# Patient Record
Sex: Female | Born: 1950
Health system: Southern US, Community
[De-identification: ages and names within clinical notes are randomized; demographics above are authoritative.]

## PROBLEM LIST (undated history)

## (undated) DIAGNOSIS — F32A Depression, unspecified: Secondary | ICD-10-CM

## (undated) DIAGNOSIS — M542 Cervicalgia: Secondary | ICD-10-CM

## (undated) DIAGNOSIS — D649 Anemia, unspecified: Secondary | ICD-10-CM

## (undated) DIAGNOSIS — M2040 Other hammer toe(s) (acquired), unspecified foot: Secondary | ICD-10-CM

## (undated) DIAGNOSIS — M436 Torticollis: Secondary | ICD-10-CM

## (undated) DIAGNOSIS — F329 Major depressive disorder, single episode, unspecified: Secondary | ICD-10-CM

## (undated) DIAGNOSIS — R630 Anorexia: Secondary | ICD-10-CM

## (undated) DIAGNOSIS — F419 Anxiety disorder, unspecified: Secondary | ICD-10-CM

## (undated) HISTORY — DX: Anorexia: R63.0

## (undated) HISTORY — DX: Anxiety disorder, unspecified: F41.9

## (undated) HISTORY — DX: Depression, unspecified: F32.A

## (undated) HISTORY — PX: SHOULDER SURGERY: SHX246

## (undated) HISTORY — PX: TONSILECTOMY/ADENOIDECTOMY WITH MYRINGOTOMY: SHX6125

## (undated) HISTORY — DX: Major depressive disorder, single episode, unspecified: F32.9

## (undated) HISTORY — PX: INNER EAR SURGERY: SHX679

## (undated) HISTORY — PX: TUBAL LIGATION: SHX77

---

## 2005-05-28 ENCOUNTER — Ambulatory Visit: Payer: Self-pay | Admitting: Unknown Physician Specialty

## 2005-07-16 ENCOUNTER — Ambulatory Visit: Payer: Self-pay | Admitting: Unknown Physician Specialty

## 2005-10-15 ENCOUNTER — Ambulatory Visit: Payer: Self-pay | Admitting: Gastroenterology

## 2006-07-29 ENCOUNTER — Ambulatory Visit: Payer: Self-pay | Admitting: Unknown Physician Specialty

## 2007-01-06 ENCOUNTER — Ambulatory Visit: Payer: Self-pay | Admitting: Unknown Physician Specialty

## 2007-11-24 ENCOUNTER — Ambulatory Visit: Payer: Self-pay | Admitting: Unknown Physician Specialty

## 2008-06-13 LAB — HM COLONOSCOPY: HM Colonoscopy: NORMAL

## 2009-03-11 ENCOUNTER — Ambulatory Visit: Payer: Self-pay | Admitting: Family Medicine

## 2010-03-24 ENCOUNTER — Ambulatory Visit: Payer: Self-pay | Admitting: Family Medicine

## 2010-03-24 ENCOUNTER — Ambulatory Visit: Payer: Self-pay | Admitting: Cardiovascular Disease

## 2010-04-08 ENCOUNTER — Ambulatory Visit: Payer: Self-pay | Admitting: Family Medicine

## 2010-07-10 ENCOUNTER — Emergency Department: Payer: Self-pay | Admitting: Emergency Medicine

## 2011-05-11 ENCOUNTER — Ambulatory Visit: Payer: Self-pay | Admitting: Family Medicine

## 2012-03-23 LAB — HM PAP SMEAR: HM Pap smear: NORMAL

## 2012-05-16 ENCOUNTER — Ambulatory Visit: Payer: Self-pay | Admitting: Family Medicine

## 2012-05-16 LAB — HM MAMMOGRAPHY: HM Mammogram: NORMAL

## 2013-06-13 ENCOUNTER — Ambulatory Visit (INDEPENDENT_AMBULATORY_CARE_PROVIDER_SITE_OTHER): Payer: 59 | Admitting: Internal Medicine

## 2013-06-13 ENCOUNTER — Encounter: Payer: Self-pay | Admitting: Internal Medicine

## 2013-06-13 ENCOUNTER — Other Ambulatory Visit (HOSPITAL_COMMUNITY)
Admission: RE | Admit: 2013-06-13 | Discharge: 2013-06-13 | Disposition: A | Discharge status: Home / Self Care | Payer: 59 | Source: Ambulatory Visit | Attending: Internal Medicine | Admitting: Internal Medicine

## 2013-06-13 VITALS — BP 144/70 | HR 81 | Temp 98.4°F | Ht 63.0 in | Wt 110.0 lb

## 2013-06-13 DIAGNOSIS — Z1151 Encounter for screening for human papillomavirus (HPV): Secondary | ICD-10-CM | POA: Insufficient documentation

## 2013-06-13 DIAGNOSIS — Z01419 Encounter for gynecological examination (general) (routine) without abnormal findings: Secondary | ICD-10-CM | POA: Insufficient documentation

## 2013-06-13 DIAGNOSIS — Z1239 Encounter for other screening for malignant neoplasm of breast: Secondary | ICD-10-CM | POA: Insufficient documentation

## 2013-06-13 DIAGNOSIS — Z780 Asymptomatic menopausal state: Secondary | ICD-10-CM

## 2013-06-13 DIAGNOSIS — Z0000 Encounter for general adult medical examination without abnormal findings: Secondary | ICD-10-CM

## 2013-06-13 LAB — HM PAP SMEAR: HM Pap smear: NEGATIVE

## 2013-06-13 NOTE — Progress Notes (Signed)
Subjective:    Patient ID: Robyn House, female    DOB: 02/20/1951, 62 y.o.   MRN: [id number]  HPI 62YO female presents to establish care and for physical exam. Doing well. Follows healthy, vegetarian diet and exercises daily. No concerns today. Due for mammogram and bone density testing.  Outpatient Encounter Prescriptions as of 06/13/2013  Medication Sig Dispense Refill  . aspirin 81 MG tablet Take 81 mg by mouth daily.      . MULTIPLE VITAMINS PO Take by mouth.       No facility-administered encounter medications on file as of 06/13/2013.   BP 144/70  Pulse 81  Temp(Src) 98.4 F (36.9 C) (Oral)  Ht 5\' 3"  (1.6 m)  Wt 110 lb (49.896 kg)  BMI 19.49 kg/m2  SpO2 97%  Review of Systems  Constitutional: Negative for fever, chills, appetite change, fatigue and unexpected weight change.  HENT: Negative for ear pain, congestion, sore throat, trouble swallowing, neck pain, voice change and sinus pressure.   Eyes: Negative for visual disturbance.  Respiratory: Negative for cough, shortness of breath, wheezing and stridor.   Cardiovascular: Negative for chest pain, palpitations and leg swelling.  Gastrointestinal: Negative for nausea, vomiting, abdominal pain, diarrhea, constipation, blood in stool, abdominal distention and anal bleeding.  Genitourinary: Negative for dysuria and flank pain.  Musculoskeletal: Negative for myalgias, arthralgias and gait problem.  Skin: Negative for color change and rash.  Neurological: Negative for dizziness and headaches.  Hematological: Negative for adenopathy. Does not bruise/bleed easily.  Psychiatric/Behavioral: Negative for suicidal ideas, sleep disturbance and dysphoric mood. The patient is not nervous/anxious.        Objective:   Physical Exam  Constitutional: She is oriented to person, place, and time. She appears well-developed and well-nourished. No distress.  HENT:  Head: Normocephalic and atraumatic.  Right Ear: External ear normal.   Left Ear: External ear normal.  Nose: Nose normal.  Mouth/Throat: Oropharynx is clear and moist. No oropharyngeal exudate.  Eyes: Conjunctivae are normal. Pupils are equal, round, and reactive to light. Right eye exhibits no discharge. Left eye exhibits no discharge. No scleral icterus.  Neck: Normal range of motion. Neck supple. No tracheal deviation present. No thyromegaly present.  Cardiovascular: Normal rate, regular rhythm, normal heart sounds and intact distal pulses.  Exam reveals no gallop and no friction rub.   No murmur heard. Pulmonary/Chest: Effort normal and breath sounds normal. No accessory muscle usage. Not tachypneic. No respiratory distress. She has no decreased breath sounds. She has no wheezes. She has no rhonchi. She has no rales. She exhibits no tenderness.  Abdominal: Soft. Bowel sounds are normal. She exhibits no distension and no mass. There is no tenderness. There is no rebound and no guarding.  Genitourinary: Rectum normal, vagina normal and uterus normal. No breast swelling, tenderness, discharge or bleeding. Pelvic exam was performed with patient supine. There is no rash, tenderness or lesion on the right labia. There is no rash, tenderness or lesion on the left labia. Uterus is not enlarged and not tender. Cervix exhibits friability. Cervix exhibits no motion tenderness and no discharge. Right adnexum displays no mass, no tenderness and no fullness. Left adnexum displays no mass, no tenderness and no fullness. No erythema or tenderness around the vagina. No vaginal discharge found.  Musculoskeletal: Normal range of motion. She exhibits no edema and no tenderness.  Lymphadenopathy:    She has no cervical adenopathy.  Neurological: She is alert and oriented to person, place, and time. No cranial  nerve deficit. She exhibits normal muscle tone. Coordination normal.  Skin: Skin is warm and dry. No rash noted. She is not diaphoretic. No erythema. No pallor.  Psychiatric: She  has a normal mood and affect. Her behavior is normal. Judgment and thought content normal.          Assessment & Plan:

## 2013-06-13 NOTE — Assessment & Plan Note (Signed)
General medical exam including breast and pelvic exam normal today. PAP is pending. Mammogram and bone density testing scheduled. Will check labs including CBC, CMP, lipids, A1c, urine microalbumin, Vit D, TSH. Encouraged continued healthy diet and regular physical activity. Follow up 1 year and prn.

## 2013-06-15 ENCOUNTER — Encounter: Payer: Self-pay | Admitting: *Deleted

## 2013-06-22 ENCOUNTER — Telehealth: Payer: Self-pay | Admitting: Internal Medicine

## 2013-06-22 NOTE — Telephone Encounter (Signed)
Left message to call back  

## 2013-06-22 NOTE — Telephone Encounter (Signed)
Labs were normal from 06/21/2013 except for Vit D which was slightly low at 24. I would recommend taking OTC Vit D 2000units daily.

## 2013-06-22 NOTE — Telephone Encounter (Signed)
Sorry, this does not come up in any of my orders. I will take care of this right now

## 2013-06-22 NOTE — Telephone Encounter (Signed)
Patient would like to know why she has not heard anything about getting her bone density test done, they have not received the order.

## 2013-06-25 NOTE — Telephone Encounter (Signed)
Patient informed and verbally agreed understanding.  

## 2013-06-25 NOTE — Telephone Encounter (Signed)
LMTCB

## 2013-07-02 ENCOUNTER — Encounter: Payer: Self-pay | Admitting: Internal Medicine

## 2013-07-05 ENCOUNTER — Telehealth: Payer: Self-pay | Admitting: *Deleted

## 2013-07-05 NOTE — Telephone Encounter (Signed)
LVM for patient to call our office to inform her she can schedule her own mammo, and bone density was scheduled for 8/14 @ 9am with [place]. (This was scheduled in our office on 7/30)

## 2013-07-05 NOTE — Telephone Encounter (Signed)
Patient left a voicemail stating she is still waiting on someone to call her about scheduling her mammogram and bone density test. She would like to get this taken care of soon.

## 2013-08-07 ENCOUNTER — Ambulatory Visit: Payer: Self-pay | Admitting: Internal Medicine

## 2013-08-07 LAB — HM MAMMOGRAPHY: HM Mammogram: NORMAL

## 2013-08-13 ENCOUNTER — Ambulatory Visit: Payer: Self-pay | Admitting: Specialist

## 2013-08-13 ENCOUNTER — Telehealth: Payer: Self-pay | Admitting: Internal Medicine

## 2013-08-13 NOTE — Telephone Encounter (Signed)
Left message to call back  

## 2013-08-13 NOTE — Telephone Encounter (Signed)
Bone density test showed T-score -2. Please make the pt a follow up visit to discuss this.

## 2013-08-20 ENCOUNTER — Encounter: Payer: Self-pay | Admitting: Internal Medicine

## 2013-08-21 ENCOUNTER — Encounter: Payer: Self-pay | Admitting: *Deleted

## 2013-08-21 ENCOUNTER — Encounter: Payer: Self-pay | Admitting: Internal Medicine

## 2013-08-21 NOTE — Telephone Encounter (Signed)
Patient never returned call, letter mailed to home address on file.

## 2013-09-20 ENCOUNTER — Other Ambulatory Visit: Payer: Self-pay

## 2013-09-28 ENCOUNTER — Ambulatory Visit (INDEPENDENT_AMBULATORY_CARE_PROVIDER_SITE_OTHER): Payer: 59 | Admitting: Internal Medicine

## 2013-09-28 ENCOUNTER — Encounter: Payer: Self-pay | Admitting: Internal Medicine

## 2013-09-28 VITALS — BP 124/80 | HR 62 | Temp 97.9°F | Ht 63.0 in | Wt 116.5 lb

## 2013-09-28 DIAGNOSIS — M8580 Other specified disorders of bone density and structure, unspecified site: Secondary | ICD-10-CM | POA: Insufficient documentation

## 2013-09-28 DIAGNOSIS — M899 Disorder of bone, unspecified: Secondary | ICD-10-CM

## 2013-09-28 MED ORDER — ALENDRONATE SODIUM 70 MG PO TABS: 70.0000 mg | ORAL_TABLET | ORAL | Status: DC

## 2013-09-28 NOTE — Progress Notes (Signed)
  Subjective:    Patient ID: Robyn House, female    DOB: May 31, 1951, 62 y.o.   MRN: [id number]  HPI 62 year old female presents to followup recent bone density testing. Bone density testing showed T score of -2. She has no previous history of osteopenia or osteoporosis. She does have a history of left wrist fracture after a fall. She also has family history of osteoporosis. She follows a healthy, vegetarian diet, which is rich in calcium. She gets regular exercise including lifting weights. Recent vitamin D level was 24.2.  Outpatient Encounter Prescriptions as of 09/28/2013  Medication Sig  . aspirin 81 MG tablet Take 81 mg by mouth daily.  . MULTIPLE VITAMINS PO Take by mouth.  Marland Kitchen alendronate (FOSAMAX) 70 MG tablet Take 1 tablet (70 mg total) by mouth every 7 (seven) days. Take with a full glass of water on an empty stomach.   BP 124/80  Pulse 62  Temp(Src) 97.9 F (36.6 C) (Oral)  Ht 5\' 3"  (1.6 m)  Wt 116 lb 8 oz (52.844 kg)  BMI 20.64 kg/m2  SpO2 99%  Review of Systems  Constitutional: Negative for fever, chills, appetite change, fatigue and unexpected weight change.  HENT: Negative for congestion, ear pain, sinus pressure, sore throat, trouble swallowing and voice change.   Eyes: Negative for visual disturbance.  Respiratory: Negative for cough, shortness of breath, wheezing and stridor.   Cardiovascular: Negative for chest pain, palpitations and leg swelling.  Gastrointestinal: Negative for nausea, vomiting, abdominal pain, diarrhea, constipation, blood in stool, abdominal distention and anal bleeding.  Genitourinary: Negative for dysuria and flank pain.  Musculoskeletal: Negative for arthralgias, gait problem, myalgias and neck pain.  Skin: Negative for color change and rash.  Neurological: Negative for dizziness and headaches.  Hematological: Negative for adenopathy. Does not bruise/bleed easily.  Psychiatric/Behavioral: Negative for suicidal ideas, sleep disturbance and  dysphoric mood. The patient is not nervous/anxious.        Objective:   Physical Exam  Constitutional: She is oriented to person, place, and time. She appears well-developed and well-nourished. No distress.  HENT:  Head: Normocephalic and atraumatic.  Right Ear: External ear normal.  Left Ear: External ear normal.  Nose: Nose normal.  Mouth/Throat: Oropharynx is clear and moist. No oropharyngeal exudate.  Eyes: Conjunctivae are normal. Pupils are equal, round, and reactive to light. Right eye exhibits no discharge. Left eye exhibits no discharge. No scleral icterus.  Neck: Normal range of motion. Neck supple. No tracheal deviation present. No thyromegaly present.  Cardiovascular: Normal rate, regular rhythm, normal heart sounds and intact distal pulses.  Exam reveals no gallop and no friction rub.   No murmur heard. Pulmonary/Chest: Effort normal and breath sounds normal. No accessory muscle usage. Not tachypneic. No respiratory distress. She has no decreased breath sounds. She has no wheezes. She has no rhonchi. She has no rales. She exhibits no tenderness.  Musculoskeletal: Normal range of motion. She exhibits no edema and no tenderness.  Lymphadenopathy:    She has no cervical adenopathy.  Neurological: She is alert and oriented to person, place, and time. No cranial nerve deficit. She exhibits normal muscle tone. Coordination normal.  Skin: Skin is warm and dry. No rash noted. She is not diaphoretic. No erythema. No pallor.  Psychiatric: She has a normal mood and affect. Her behavior is normal. Judgment and thought content normal.          Assessment & Plan:

## 2013-09-28 NOTE — Assessment & Plan Note (Addendum)
Reviewed recent bone density testing with T-score -2.0. Reviewed options for management. Recommended adequate dietary intake of calcium, vitamin D supplementation 2000 units daily based on previously low vitamin D level of 24.2. We discussed that benefits and risk of bisphosphonate. Patient would like to proceed with Fosamax 70 mg weekly. Encouraged her to continue efforts at weight bearing exercise. Plan to repeat bone density testing in September 2016.  Over of which >50% spent in face-to-face contact with patient discussing plan of care

## 2013-09-28 NOTE — Progress Notes (Signed)
Pre-visit discussion using our clinic review tool. No additional management support is needed unless otherwise documented below in the visit note.  

## 2013-11-20 ENCOUNTER — Other Ambulatory Visit: Payer: Self-pay | Admitting: *Deleted

## 2013-11-20 DIAGNOSIS — M8580 Other specified disorders of bone density and structure, unspecified site: Secondary | ICD-10-CM

## 2013-11-20 MED ORDER — ALENDRONATE SODIUM 70 MG PO TABS: 70.0000 mg | ORAL_TABLET | ORAL | Status: DC

## 2014-04-29 ENCOUNTER — Ambulatory Visit (INDEPENDENT_AMBULATORY_CARE_PROVIDER_SITE_OTHER): Payer: 59 | Admitting: Internal Medicine

## 2014-04-29 ENCOUNTER — Encounter: Payer: Self-pay | Admitting: Internal Medicine

## 2014-04-29 VITALS — BP 130/60 | HR 66 | Temp 97.9°F | Ht 63.0 in | Wt 114.0 lb

## 2014-04-29 DIAGNOSIS — J209 Acute bronchitis, unspecified: Secondary | ICD-10-CM | POA: Insufficient documentation

## 2014-04-29 MED ORDER — LEVOFLOXACIN 500 MG PO TABS: 500.0000 mg | ORAL_TABLET | Freq: Every day | ORAL | Status: DC

## 2014-04-29 NOTE — Progress Notes (Signed)
Pre visit review using our clinic review tool, if applicable. No additional management support is needed unless otherwise documented below in the visit note. 

## 2014-04-29 NOTE — Patient Instructions (Signed)
Start Levaquin 500mg  daily.  Use over the counter Robitussin for cough. Call if cough is persistent or worsening.

## 2014-04-29 NOTE — Assessment & Plan Note (Signed)
Symptoms and exam most consistent with bronchitis. Will start Levaquin. Use Robitussin prn cough. Follow up if symptoms are not improving over next few days.

## 2014-04-29 NOTE — Progress Notes (Signed)
   Subjective:    Patient ID: Robyn House, female    DOB: [birth year], 63 y.o.   MRN: [id number]  HPI 63YO female presents for acute visit.  Developed "head cold" in early May with congestion, malaise. Congestion has persisted over last several week. Occasional productive cough. Taking Claritin and Sudafed with minimal improvement. No chest pain or dypspnea. Continues to exercise. Some pressure in sinuses bilaterally with sinus headaches.  Review of Systems  Constitutional: Negative for fever, chills and unexpected weight change.  HENT: Positive for congestion, rhinorrhea and sinus pressure. Negative for ear discharge, ear pain, facial swelling, hearing loss, mouth sores, nosebleeds, postnasal drip, sneezing, sore throat, tinnitus, trouble swallowing and voice change.   Eyes: Negative for pain, discharge, redness and visual disturbance.  Respiratory: Positive for cough. Negative for chest tightness, shortness of breath, wheezing and stridor.   Cardiovascular: Negative for chest pain, palpitations and leg swelling.  Musculoskeletal: Negative for arthralgias, myalgias, neck pain and neck stiffness.  Skin: Negative for color change and rash.  Neurological: Negative for dizziness, weakness, light-headedness and headaches.  Hematological: Negative for adenopathy.       Objective:    BP 130/60  Pulse 66  Temp(Src) 97.9 F (36.6 C) (Oral)  Ht 5\' 3"  (1.6 m)  Wt 114 lb (51.71 kg)  BMI 20.20 kg/m2  SpO2 99% Physical Exam  Constitutional: She is oriented to person, place, and time. She appears well-developed and well-nourished. No distress.  HENT:  Head: Normocephalic and atraumatic.  Right Ear: External ear normal.  Left Ear: External ear normal.  Nose: Nose normal.  Mouth/Throat: Oropharynx is clear and moist. No oropharyngeal exudate.  Eyes: Conjunctivae are normal. Pupils are equal, round, and reactive to light. Right eye exhibits no discharge. Left eye exhibits no discharge. No  scleral icterus.  Neck: Normal range of motion. Neck supple. No tracheal deviation present. No thyromegaly present.  Cardiovascular: Normal rate, regular rhythm, normal heart sounds and intact distal pulses.  Exam reveals no gallop and no friction rub.   No murmur heard. Pulmonary/Chest: Effort normal. No accessory muscle usage. Not tachypneic. No respiratory distress. She has no decreased breath sounds. She has no wheezes. She has rhonchi. She has no rales. She exhibits no tenderness.  Musculoskeletal: Normal range of motion. She exhibits no edema and no tenderness.  Lymphadenopathy:    She has no cervical adenopathy.  Neurological: She is alert and oriented to person, place, and time. No cranial nerve deficit. She exhibits normal muscle tone. Coordination normal.  Skin: Skin is warm and dry. No rash noted. She is not diaphoretic. No erythema. No pallor.  Psychiatric: She has a normal mood and affect. Her behavior is normal. Judgment and thought content normal.          Assessment & Plan:   Problem List Items Addressed This Visit     Unprioritized   Acute bronchitis - Primary     Symptoms and exam most consistent with bronchitis. Will start Levaquin. Use Robitussin prn cough. Follow up if symptoms are not improving over next few days.    Relevant Medications      levofloxacin (LEVAQUIN) tablet       Return if symptoms worsen or fail to improve.

## 2014-06-12 ENCOUNTER — Encounter: Payer: 59 | Admitting: Internal Medicine

## 2014-06-17 ENCOUNTER — Encounter: Payer: 59 | Admitting: Internal Medicine

## 2014-07-04 ENCOUNTER — Encounter: Payer: Self-pay | Admitting: Internal Medicine

## 2014-07-04 ENCOUNTER — Ambulatory Visit (INDEPENDENT_AMBULATORY_CARE_PROVIDER_SITE_OTHER): Payer: BC Managed Care – PPO | Admitting: Internal Medicine

## 2014-07-04 VITALS — BP 118/58 | HR 68 | Temp 98.1°F | Resp 12 | Ht 62.75 in | Wt 112.2 lb

## 2014-07-04 DIAGNOSIS — Z0000 Encounter for general adult medical examination without abnormal findings: Secondary | ICD-10-CM

## 2014-07-04 DIAGNOSIS — M8580 Other specified disorders of bone density and structure, unspecified site: Secondary | ICD-10-CM

## 2014-07-04 DIAGNOSIS — M949 Disorder of cartilage, unspecified: Secondary | ICD-10-CM

## 2014-07-04 DIAGNOSIS — M899 Disorder of bone, unspecified: Secondary | ICD-10-CM

## 2014-07-04 MED ORDER — ALENDRONATE SODIUM 70 MG PO TABS: 70.0000 mg | ORAL_TABLET | ORAL | Status: DC

## 2014-07-04 NOTE — Assessment & Plan Note (Signed)
General medical exam including breast exam normal today. PAP and pelvic deferred as PAP normal in 2014. Plan repeat PAP in 2017. Mammogram ordered. Bone density UTD. Labs today including CBC, CMP, lipids, Vit D. Immunizations UTD. Flu vaccine this fall.

## 2014-07-04 NOTE — Progress Notes (Signed)
Subjective:    Patient ID: Robyn House, female    DOB: [birth year], 63 y.o.   MRN: [id number]  HPI 63YO female presents for follow up.  Seen by Dr. Tamala Julian today for bilateral shoulder pain. Had steroid injection on the right and starting meloxicam.  Otherwise, feeling well. Exercising by walking, aerobics, Zumba, Pilates, and Yoga. Eating a healthy diet. No new concerns today.   Review of Systems  Constitutional: Negative for fever, chills, appetite change, fatigue and unexpected weight change.  Eyes: Negative for visual disturbance.  Respiratory: Negative for shortness of breath.   Cardiovascular: Negative for chest pain and leg swelling.  Gastrointestinal: Negative for nausea, vomiting, abdominal pain, diarrhea, constipation and blood in stool.  Musculoskeletal: Positive for arthralgias and myalgias.  Skin: Negative for color change and rash.  Hematological: Negative for adenopathy. Does not bruise/bleed easily.  Psychiatric/Behavioral: Negative for dysphoric mood. The patient is not nervous/anxious.        Objective:    BP 118/58  Pulse 68  Temp(Src) 98.1 F (36.7 C) (Oral)  Resp 12  Ht 5' 2.75" (1.594 m)  Wt 112 lb 4 oz (50.916 kg)  BMI 20.04 kg/m2  SpO2 98% Physical Exam  Constitutional: She is oriented to person, place, and time. She appears well-developed and well-nourished. No distress.  HENT:  Head: Normocephalic and atraumatic.  Right Ear: External ear normal.  Left Ear: External ear normal.  Nose: Nose normal.  Mouth/Throat: Oropharynx is clear and moist. No oropharyngeal exudate.  Eyes: Conjunctivae are normal. Pupils are equal, round, and reactive to light. Right eye exhibits no discharge. Left eye exhibits no discharge. No scleral icterus.  Neck: Normal range of motion. Neck supple. No tracheal deviation present. No thyromegaly present.  Cardiovascular: Normal rate, regular rhythm, normal heart sounds and intact distal pulses.  Exam reveals no gallop  and no friction rub.   No murmur heard. Pulmonary/Chest: Effort normal and breath sounds normal. No accessory muscle usage. Not tachypneic. No respiratory distress. She has no decreased breath sounds. She has no wheezes. She has no rales. She exhibits no tenderness. Right breast exhibits no inverted nipple, no mass, no nipple discharge, no skin change and no tenderness. Left breast exhibits no inverted nipple, no mass, no nipple discharge, no skin change and no tenderness. Breasts are symmetrical.  Abdominal: Soft. Bowel sounds are normal. She exhibits no distension and no mass. There is no tenderness. There is no rebound and no guarding.  Musculoskeletal: She exhibits no edema and no tenderness.       Right shoulder: She exhibits decreased range of motion and pain. She exhibits no tenderness.       Left shoulder: She exhibits decreased range of motion and pain. She exhibits no tenderness.  Lymphadenopathy:    She has no cervical adenopathy.  Neurological: She is alert and oriented to person, place, and time. No cranial nerve deficit. She exhibits normal muscle tone. Coordination normal.  Skin: Skin is warm and dry. No rash noted. She is not diaphoretic. No erythema. No pallor.  Psychiatric: She has a normal mood and affect. Her behavior is normal. Judgment and thought content normal.          Assessment & Plan:   Problem List Items Addressed This Visit     Unprioritized   Osteopenia   Relevant Medications      alendronate (FOSAMAX) 70 MG tablet   Routine general medical examination at a health care facility - Primary  General medical exam including breast exam normal today. PAP and pelvic deferred as PAP normal in 2014. Plan repeat PAP in 2017. Mammogram ordered. Bone density UTD. Labs today including CBC, CMP, lipids, Vit D. Immunizations UTD. Flu vaccine this fall.    Relevant Orders      MM Digital Screening       Return in about 1 year (around 07/05/2015) for Physical.

## 2014-07-04 NOTE — Progress Notes (Signed)
Pre visit review using our clinic review tool, if applicable. No additional management support is needed unless otherwise documented below in the visit note. 

## 2014-07-04 NOTE — Patient Instructions (Signed)

## 2014-07-06 LAB — HEPATIC FUNCTION PANEL
ALT: 18 U/L (ref 7–35)
AST: 23 U/L (ref 13–35)
Alkaline Phosphatase: 56 U/L (ref 25–125)
Bilirubin, Total: 0.4 mg/dL

## 2014-07-06 LAB — BASIC METABOLIC PANEL
BUN: 19 mg/dL (ref 4–21)
Creatinine: 0.8 mg/dL (ref 0.5–1.1)
Glucose: 106 mg/dL
Potassium: 5.1 mmol/L (ref 3.4–5.3)
Sodium: 137 mmol/L (ref 137–147)

## 2014-07-06 LAB — LIPID PANEL
Cholesterol: 202 mg/dL — AB (ref 0–200)
HDL: 93 mg/dL — AB (ref 35–70)
LDL Cholesterol: 98 mg/dL
Triglycerides: 57 mg/dL (ref 40–160)

## 2014-07-06 LAB — CBC AND DIFFERENTIAL
HCT: 35 % — AB (ref 36–46)
Hemoglobin: 12.2 g/dL (ref 12.0–16.0)
Neutrophils Absolute: 11 /uL
Platelets: 329 10*3/uL (ref 150–399)
WBC: 12.7 10^3/mL

## 2014-07-06 LAB — TSH: TSH: 0.76 u[IU]/mL (ref 0.41–5.90)

## 2014-07-08 ENCOUNTER — Telehealth: Payer: Self-pay | Admitting: Internal Medicine

## 2014-07-08 NOTE — Telephone Encounter (Signed)
Sent mychart . Pt has appt 07/10/14

## 2014-07-08 NOTE — Telephone Encounter (Signed)
Recent labs from [facility] showed normal kidney and liver function.  WBC was slightly elevated.  Thyroid function was normal  Vit D was slightly low.  I would recommend that we repeat a CBC in 4 weeks. I would also recommend starting Vit D 2000units daily which is available OTC.

## 2014-07-10 NOTE — Telephone Encounter (Signed)
Mailed unread message to pt  

## 2014-07-12 ENCOUNTER — Encounter: Payer: Self-pay | Admitting: Internal Medicine

## 2014-07-25 ENCOUNTER — Telehealth: Payer: Self-pay | Admitting: Internal Medicine

## 2014-07-25 NOTE — Telephone Encounter (Signed)
[facility] order form in folder ready to be completed

## 2014-07-25 NOTE — Telephone Encounter (Signed)
Pt is needing to get follow up labs done and would like to have them done at [facility]. She said she would like to pick up the form at the beginning of the next week.

## 2014-08-05 ENCOUNTER — Encounter: Payer: Self-pay | Admitting: Internal Medicine

## 2014-08-08 ENCOUNTER — Telehealth: Payer: Self-pay | Admitting: Internal Medicine

## 2014-08-08 NOTE — Telephone Encounter (Signed)
Thyroid function was normal.

## 2014-08-08 NOTE — Telephone Encounter (Signed)
Sent my chart with results. 

## 2014-09-19 ENCOUNTER — Ambulatory Visit: Payer: Self-pay | Admitting: Internal Medicine

## 2014-09-27 ENCOUNTER — Encounter: Payer: Self-pay | Admitting: Internal Medicine

## 2014-10-21 ENCOUNTER — Encounter: Payer: Self-pay | Admitting: Internal Medicine

## 2014-11-13 ENCOUNTER — Emergency Department: Payer: Self-pay | Admitting: Internal Medicine

## 2014-11-13 LAB — CBC WITH DIFFERENTIAL/PLATELET
Basophil #: 0 10*3/uL (ref 0.0–0.1)
Basophil %: 0.7 %
Eosinophil #: 0.1 10*3/uL (ref 0.0–0.7)
Eosinophil %: 1.4 %
HCT: 39.9 % (ref 35.0–47.0)
HGB: 13.3 g/dL (ref 12.0–16.0)
Lymphocyte #: 1.1 10*3/uL (ref 1.0–3.6)
Lymphocyte %: 19.6 %
MCH: 31.3 pg (ref 26.0–34.0)
MCHC: 33.3 g/dL (ref 32.0–36.0)
MCV: 94 fL (ref 80–100)
Monocyte #: 0.4 x10 3/mm (ref 0.2–0.9)
Monocyte %: 7.3 %
Neutrophil #: 4.1 10*3/uL (ref 1.4–6.5)
Neutrophil %: 71 %
Platelet: 299 10*3/uL (ref 150–440)
RBC: 4.25 10*6/uL (ref 3.80–5.20)
RDW: 12.4 % (ref 11.5–14.5)
WBC: 5.8 10*3/uL (ref 3.6–11.0)

## 2014-11-13 LAB — COMPREHENSIVE METABOLIC PANEL
Albumin: 3.8 g/dL (ref 3.4–5.0)
Alkaline Phosphatase: 46 U/L
Anion Gap: 9 (ref 7–16)
BUN: 11 mg/dL (ref 7–18)
Bilirubin,Total: 0.6 mg/dL (ref 0.2–1.0)
Calcium, Total: 8.7 mg/dL (ref 8.5–10.1)
Chloride: 108 mmol/L — ABNORMAL HIGH (ref 98–107)
Co2: 23 mmol/L (ref 21–32)
Creatinine: 0.7 mg/dL (ref 0.60–1.30)
EGFR (African American): >60
EGFR (Non-African Amer.): >60
Glucose: 90 mg/dL (ref 65–99)
Osmolality: 278 (ref 275–301)
Potassium: 3.3 mmol/L — ABNORMAL LOW (ref 3.5–5.1)
SGOT(AST): 29 U/L (ref 15–37)
SGPT (ALT): 24 U/L
Sodium: 140 mmol/L (ref 136–145)
Total Protein: 7.8 g/dL (ref 6.4–8.2)

## 2014-11-13 LAB — URINALYSIS, COMPLETE
Bacteria: NONE SEEN
Bilirubin,UR: NEGATIVE
Blood: NEGATIVE
Glucose,UR: NEGATIVE mg/dL (ref 0–75)
Leukocyte Esterase: NEGATIVE
Nitrite: NEGATIVE
Ph: 6 (ref 4.5–8.0)
Protein: NEGATIVE
RBC,UR: NONE SEEN /HPF (ref 0–5)
Specific Gravity: 1.01 (ref 1.003–1.030)
Squamous Epithelial: <1
WBC UR: <1 /HPF (ref 0–5)

## 2014-11-13 LAB — OCCULT BLOOD X 1 CARD TO LAB, STOOL: Occult Blood, Feces: NEGATIVE

## 2014-11-13 LAB — LIPASE, BLOOD: Lipase: 160 U/L (ref 73–393)

## 2014-11-13 LAB — TROPONIN I: Troponin-I: <0.02 ng/mL

## 2014-11-13 LAB — CLOSTRIDIUM DIFFICILE(ARMC)

## 2014-11-14 LAB — WBCS, STOOL

## 2014-11-15 LAB — STOOL CULTURE

## 2015-05-01 ENCOUNTER — Ambulatory Visit (INDEPENDENT_AMBULATORY_CARE_PROVIDER_SITE_OTHER): Payer: 59 | Admitting: Nurse Practitioner

## 2015-05-01 ENCOUNTER — Encounter: Payer: Self-pay | Admitting: Nurse Practitioner

## 2015-05-01 VITALS — BP 112/60 | HR 80 | Temp 97.5°F | Resp 16 | Ht 63.0 in | Wt 113.4 lb

## 2015-05-01 DIAGNOSIS — M542 Cervicalgia: Secondary | ICD-10-CM | POA: Insufficient documentation

## 2015-05-01 DIAGNOSIS — G8929 Other chronic pain: Secondary | ICD-10-CM | POA: Insufficient documentation

## 2015-05-01 MED ORDER — CYCLOBENZAPRINE HCL 5 MG PO TABS: 5.0000 mg | ORAL_TABLET | Freq: Three times a day (TID) | ORAL | Status: DC | PRN

## 2015-05-01 MED ORDER — PREDNISONE 10 MG PO TABS: ORAL_TABLET | ORAL | Status: DC

## 2015-05-01 NOTE — Assessment & Plan Note (Signed)
Will try prednisone taper and night time flexeril. Pt will start Sunday. Asked her to call if not helpful and we will obtain a neck x-ray. No neuro deficits today, trapezius bilaterally tight

## 2015-05-01 NOTE — Progress Notes (Signed)
   Subjective:    Patient ID: Robyn House, female    DOB: 12/15/1950, 64 y.o.   MRN: [id number]  HPI  Robyn House is a 63 yo female with a  CC of neck pain x 6 weeks.   1) Left side, sharp, stabbing, burning  "piercing", becoming more frequent, does not bother as much at night, does not wake her up at night.  Ibuprofen- helped somewhat  Heating pad- somewhat helpful Massage- not helpful    Walks most days, intense pain by end of walking  Denies radiating, no preceding events Lots of stress at work   Review of Systems  Constitutional: Negative for fever, chills, diaphoresis and fatigue.  Respiratory: Negative for chest tightness, shortness of breath and wheezing.   Cardiovascular: Negative for chest pain, palpitations and leg swelling.  Gastrointestinal: Negative for nausea, vomiting and diarrhea.  Musculoskeletal: Positive for neck pain. Negative for myalgias and neck stiffness.  Skin: Negative for rash.  Neurological: Negative for dizziness, weakness, numbness and headaches.  Psychiatric/Behavioral: The patient is not nervous/anxious.       Objective:   Physical Exam  Constitutional: She is oriented to person, place, and time. She appears well-developed and well-nourished. No distress.  BP 112/60 mmHg  Pulse 80  Temp(Src) 97.5 F (36.4 C)  Resp 16  Ht 5\' 3"  (1.6 m)  Wt 113 lb 6.4 oz (51.438 kg)  BMI 20.09 kg/m2  SpO2 97%   HENT:  Head: Normocephalic and atraumatic.  Right Ear: External ear normal.  Left Ear: External ear normal.  Musculoskeletal:  Trapezius muscles tight bilaterally.   Neurological: She is alert and oriented to person, place, and time. No cranial nerve deficit. She exhibits normal muscle tone. Coordination normal.  Deltoid 5/5 Bilateral, Biceps 5/5 bilateral, Wrist extensors 5/5 bilateral, Triceps 5/5 bilateral, finger flexors and abductors 5/5 bilateral, grip 5/5 bilateraly no Hoffman's, intact heel/toe/sequential walking, sensation intact upper  and lower extremities. Straight leg raise negative bilaterally. DTR's upper and lower 2+   Skin: Skin is warm and dry. No rash noted. She is not diaphoretic.  Psychiatric: She has a normal mood and affect. Her behavior is normal. Judgment and thought content normal.      Assessment & Plan:

## 2015-05-01 NOTE — Patient Instructions (Signed)
Prednisone with breakfast  6 tablets on day 1, 5 tablets on day 2, 4 tablets on day 3, 3 tablets on day 4, 2 tablets day 5, 1 tablet on day 6...done!   Call me after you finish this or if you worsen and we can get a neck x-ray.

## 2015-05-01 NOTE — Progress Notes (Signed)
Pre visit review using our clinic review tool, if applicable. No additional management support is needed unless otherwise documented below in the visit note. 

## 2015-07-11 ENCOUNTER — Ambulatory Visit (INDEPENDENT_AMBULATORY_CARE_PROVIDER_SITE_OTHER): Payer: 59 | Admitting: Internal Medicine

## 2015-07-11 ENCOUNTER — Ambulatory Visit
Admission: RE | Admit: 2015-07-11 | Discharge: 2015-07-11 | Disposition: A | Discharge status: Home / Self Care | Payer: 59 | Source: Ambulatory Visit | Attending: Internal Medicine | Admitting: Internal Medicine

## 2015-07-11 ENCOUNTER — Encounter: Payer: Self-pay | Admitting: Internal Medicine

## 2015-07-11 VITALS — BP 114/72 | HR 70 | Temp 97.7°F | Ht 63.0 in | Wt 111.0 lb

## 2015-07-11 DIAGNOSIS — M542 Cervicalgia: Secondary | ICD-10-CM

## 2015-07-11 DIAGNOSIS — Z23 Encounter for immunization: Secondary | ICD-10-CM | POA: Diagnosis not present

## 2015-07-11 DIAGNOSIS — M1288 Other specific arthropathies, not elsewhere classified, other specified site: Secondary | ICD-10-CM | POA: Insufficient documentation

## 2015-07-11 DIAGNOSIS — S91209A Unspecified open wound of unspecified toe(s) with damage to nail, initial encounter: Secondary | ICD-10-CM

## 2015-07-11 DIAGNOSIS — M8580 Other specified disorders of bone density and structure, unspecified site: Secondary | ICD-10-CM

## 2015-07-11 DIAGNOSIS — Z0000 Encounter for general adult medical examination without abnormal findings: Secondary | ICD-10-CM

## 2015-07-11 DIAGNOSIS — M47812 Spondylosis without myelopathy or radiculopathy, cervical region: Secondary | ICD-10-CM | POA: Diagnosis not present

## 2015-07-11 DIAGNOSIS — Z1239 Encounter for other screening for malignant neoplasm of breast: Secondary | ICD-10-CM

## 2015-07-11 DIAGNOSIS — M4316 Spondylolisthesis, lumbar region: Secondary | ICD-10-CM | POA: Diagnosis not present

## 2015-07-11 HISTORY — DX: Unspecified open wound of unspecified toe(s) with damage to nail, initial encounter: S91.209A

## 2015-07-11 NOTE — Assessment & Plan Note (Signed)
Chronic left sided neck pain. Minimal improvement with massage, NSAIDS and Flexeril. Will get plain xray for evaluation.

## 2015-07-11 NOTE — Progress Notes (Signed)
Subjective:    Patient ID: Robyn House, female    DOB: 05-28-1951, 64 y.o.   MRN: [id number]  HPI  64YO female presents for physical exam.  Generally feeling well.  Has some chronic left sided neck pain and decreased range of motion, ongoing for months. Taking Flexeril with minimal improvement. Also had Prednisone taper in the past with minimal improvement.   Wt Readings from Last 3 Encounters:  07/11/15 111 lb (50.349 kg)  05/01/15 113 lb 6.4 oz (51.438 kg)  07/04/14 112 lb 4 oz (50.916 kg)   BP Readings from Last 3 Encounters:  07/11/15 114/72  05/01/15 112/60  07/04/14 118/58     Past Medical History  Diagnosis Date  . Depression   . Anorexia     college   Family History  Problem Relation Age of Onset  . Stroke Mother   . Heart disease Mother     arrhythmia  . Stroke Father   . Arthritis Maternal Grandmother   . Heart disease Maternal Grandfather    Past Surgical History  Procedure Laterality Date  . Shoulder surgery Right     Rotator cuff repair  . Inner ear surgery      Right ear drum collapsed, Dr. Tami Ribas  . Tonsilectomy/adenoidectomy with myringotomy     Social History   Social History  . Marital Status: Married    Spouse Name: N/A  . Number of Children: N/A  . Years of Education: N/A   Social History Main Topics  . Smoking status: Never Smoker   . Smokeless tobacco: Never Used  . Alcohol Use: 3.0 oz/week    5 Glasses of wine per week  . Drug Use: No  . Sexual Activity: Not Asked   Other Topics Concern  . None   Social History Narrative   Lives in [place] with husband. Has 3 children and 1 step son, 88, 52, 37, 37. 1 son in [place], step son in [place]. 2 sons in [place]. No pets.      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  HENT: Negative for congestion and trouble swallowing.   Eyes: Negative for visual disturbance.  Respiratory: Negative for shortness of breath.   Cardiovascular: Negative for chest pain and leg swelling.  Gastrointestinal: Negative for nausea, vomiting, abdominal pain, diarrhea, constipation and blood in stool.  Musculoskeletal: Positive for myalgias, arthralgias, neck pain and neck stiffness.  Skin: Negative for color change and rash.  Hematological: Negative for adenopathy. Does not bruise/bleed easily.  Psychiatric/Behavioral: Negative for sleep disturbance and dysphoric mood. The patient is not nervous/anxious.        Objective:    BP 114/72 mmHg  Pulse 70  Temp(Src) 97.7 F (36.5 C) (Oral)  Ht 5\' 3"  (1.6 m)  Wt 111 lb (50.349 kg)  BMI 19.67 kg/m2  SpO2 99% Physical Exam  Constitutional: She is oriented to person, place, and time. She appears well-developed and well-nourished. No distress.  HENT:  Head: Normocephalic and atraumatic.  Right Ear: External ear normal.  Left Ear: External ear normal.  Nose: Nose normal.  Mouth/Throat: Oropharynx is clear and moist. No oropharyngeal exudate.  Eyes: Conjunctivae are normal. Pupils are equal, round, and reactive to light. Right eye exhibits no discharge. Left eye  exhibits no discharge. No scleral icterus.  Neck: Trachea normal and normal range of motion. Neck supple. No spinous process tenderness present. No rigidity. No tracheal deviation and no edema present. No thyroid mass and no thyromegaly present.    Cardiovascular: Normal rate, regular rhythm, normal heart sounds and intact distal pulses.  Exam reveals no gallop and no friction rub.   No murmur heard. Pulmonary/Chest: Effort normal and breath sounds normal. No accessory muscle usage. No tachypnea. No respiratory distress. She has no decreased breath sounds. She has no wheezes. She has no rales. She exhibits no tenderness. Right breast exhibits no inverted nipple, no  mass, no nipple discharge, no skin change and no tenderness. Left breast exhibits no inverted nipple, no mass, no nipple discharge, no skin change and no tenderness. Breasts are symmetrical.  Abdominal: Soft. Bowel sounds are normal. She exhibits no distension and no mass. There is no tenderness. There is no rebound and no guarding.  Musculoskeletal: Normal range of motion. She exhibits no edema or tenderness.  Lymphadenopathy:    She has no cervical adenopathy.  Neurological: She is alert and oriented to person, place, and time. No cranial nerve deficit. She exhibits normal muscle tone. Coordination normal.  Skin: Skin is warm and dry. No rash noted. She is not diaphoretic. No erythema. No pallor.     Psychiatric: She has a normal mood and affect. Her behavior is normal. Judgment and thought content normal.          Assessment & Plan:   Problem List Items Addressed This Visit      Unprioritized   Cervicalgia    Chronic left sided neck pain. Minimal improvement with massage, NSAIDS and Flexeril. Will get plain xray for evaluation.      Relevant Orders   DG Cervical Spine Complete   Osteopenia    Bone density and Vit D level ordered.      Relevant Orders   DG Bone Density   Routine general medical examination at a health care facility - Primary    General medical exam including breast exam normal today. PAP and pelvic deferred as normal 2014, HPV neg. Plan repeat in 2017. Labs ordered. Mammogram ordered. Dexa ordered. Encouraged healthy diet and exercise. Flu vaccine declined. Tdap today.      Screening for breast cancer   Relevant Orders   MM Digital Screening   Toenail avulsion    Will set up podiatry evaluation for avulsed toenail and bunion.      Relevant Orders   Ambulatory referral to Podiatry       Return in about 1 year (around 07/10/2016) for Physical.

## 2015-07-11 NOTE — Assessment & Plan Note (Signed)
Will set up podiatry evaluation for avulsed toenail and bunion.

## 2015-07-11 NOTE — Patient Instructions (Signed)

## 2015-07-11 NOTE — Progress Notes (Signed)
Pre visit review using our clinic review tool, if applicable. No additional management support is needed unless otherwise documented below in the visit note. 

## 2015-07-11 NOTE — Addendum Note (Signed)
Addended by: Vernetta Honey on: 07/11/2015 11:49 AM   Modules accepted: Orders

## 2015-07-11 NOTE — Assessment & Plan Note (Signed)
Bone density and Vit D level ordered.

## 2015-07-11 NOTE — Assessment & Plan Note (Signed)
General medical exam including breast exam normal today. PAP and pelvic deferred as normal 2014, HPV neg. Plan repeat in 2017. Labs ordered. Mammogram ordered. Dexa ordered. Encouraged healthy diet and exercise. Flu vaccine declined. Tdap today.

## 2015-07-14 ENCOUNTER — Other Ambulatory Visit: Payer: Self-pay | Admitting: Internal Medicine

## 2015-07-14 DIAGNOSIS — M542 Cervicalgia: Secondary | ICD-10-CM

## 2015-07-16 ENCOUNTER — Ambulatory Visit: Payer: 59 | Admitting: Podiatry

## 2015-07-22 ENCOUNTER — Telehealth: Payer: Self-pay | Admitting: Internal Medicine

## 2015-07-22 NOTE — Telephone Encounter (Signed)
Labs from 9/1 were normal.

## 2015-07-23 ENCOUNTER — Ambulatory Visit: Payer: 59

## 2015-07-24 ENCOUNTER — Ambulatory Visit
Admission: RE | Admit: 2015-07-24 | Discharge: 2015-07-24 | Disposition: A | Discharge status: Home / Self Care | Payer: 59 | Source: Ambulatory Visit | Attending: Internal Medicine | Admitting: Internal Medicine

## 2015-07-24 DIAGNOSIS — M542 Cervicalgia: Secondary | ICD-10-CM

## 2015-07-24 DIAGNOSIS — M4802 Spinal stenosis, cervical region: Secondary | ICD-10-CM | POA: Insufficient documentation

## 2015-07-24 DIAGNOSIS — M5382 Other specified dorsopathies, cervical region: Secondary | ICD-10-CM | POA: Diagnosis not present

## 2015-07-24 DIAGNOSIS — M5030 Other cervical disc degeneration, unspecified cervical region: Secondary | ICD-10-CM | POA: Diagnosis not present

## 2015-07-29 ENCOUNTER — Other Ambulatory Visit: Payer: Self-pay | Admitting: Internal Medicine

## 2015-07-29 DIAGNOSIS — M5010 Cervical disc disorder with radiculopathy, unspecified cervical region: Secondary | ICD-10-CM

## 2015-08-07 ENCOUNTER — Telehealth: Payer: Self-pay | Admitting: *Deleted

## 2015-08-07 NOTE — Telephone Encounter (Signed)
Patient dropped off forms to be filled out by Dr.Walker, these forms are for a future foot surgery.

## 2015-08-07 NOTE — Telephone Encounter (Signed)
Forms are in Dr. Derry Skill Box.

## 2015-08-08 NOTE — Telephone Encounter (Signed)
Pt will need ap appt for the form to be completed, Please call pt and schedule an appt for Surgical Clearance.  (30 min appt)

## 2015-08-27 ENCOUNTER — Encounter: Payer: Self-pay | Admitting: *Deleted

## 2015-08-28 ENCOUNTER — Ambulatory Visit (INDEPENDENT_AMBULATORY_CARE_PROVIDER_SITE_OTHER): Payer: Medicare Other | Admitting: Internal Medicine

## 2015-08-28 ENCOUNTER — Encounter: Payer: Self-pay | Admitting: Internal Medicine

## 2015-08-28 VITALS — BP 119/70 | HR 79 | Temp 97.5°F | Ht 63.0 in | Wt 112.0 lb

## 2015-08-28 DIAGNOSIS — Z01818 Encounter for other preprocedural examination: Secondary | ICD-10-CM | POA: Diagnosis not present

## 2015-08-28 DIAGNOSIS — M79672 Pain in left foot: Secondary | ICD-10-CM | POA: Diagnosis not present

## 2015-08-28 DIAGNOSIS — M21612 Bunion of left foot: Secondary | ICD-10-CM

## 2015-08-28 DIAGNOSIS — M2042 Other hammer toe(s) (acquired), left foot: Secondary | ICD-10-CM | POA: Diagnosis not present

## 2015-08-28 DIAGNOSIS — M79673 Pain in unspecified foot: Secondary | ICD-10-CM

## 2015-08-28 DIAGNOSIS — M2012 Hallux valgus (acquired), left foot: Secondary | ICD-10-CM | POA: Diagnosis not present

## 2015-08-28 DIAGNOSIS — G8929 Other chronic pain: Secondary | ICD-10-CM | POA: Insufficient documentation

## 2015-08-28 NOTE — Progress Notes (Signed)
Subjective:    Patient ID: Robyn House, female    DOB: 07-22-1951, 64 y.o.   MRN: [id number]  HPI  64YO female presents for preop visit.  Pre-op Evaluation - Scheduled for left bunionectomy and hammertoe repair. No previous issues with anesthesia or bleeding issues. No recent illnesses.   Wt Readings from Last 3 Encounters:  08/28/15 112 lb (50.803 kg)  08/27/15 111 lb (50.349 kg)  07/11/15 111 lb (50.349 kg)   BP Readings from Last 3 Encounters:  08/28/15 119/70  07/11/15 114/72  05/01/15 112/60    Past Medical History  Diagnosis Date  . Depression   . Anorexia     college  . Hammertoe     LEFT 2ND  . Cervicalgia     Had MRI, seeing neurosurgeon 09/22/15  . Stiff neck     limited turn to left  . Anemia     distant past   Family History  Problem Relation Age of Onset  . Stroke Mother   . Heart disease Mother     arrhythmia  . Stroke Father   . Arthritis Maternal Grandmother   . Heart disease Maternal Grandfather    Past Surgical History  Procedure Laterality Date  . Shoulder surgery Right     Rotator cuff repair  . Inner ear surgery      Right ear drum collapsed, Dr. Tami Ribas  . Tonsilectomy/adenoidectomy with myringotomy     Social History   Social History  . Marital Status: Married    Spouse Name: N/A  . Number of Children: N/A  . Years of Education: N/A   Social History Main Topics  . Smoking status: Never Smoker   . Smokeless tobacco: Never Used  . Alcohol Use: 4.2 oz/week    7 Glasses of wine per week  . Drug Use: No  . Sexual Activity: Not Asked   Other Topics Concern  . None   Social History Narrative   Lives in [place] with husband. Has 3 children and 1 step son, 83, 51, 87, 11. 1 son in [place], step son in [place]. 2 sons in [place]. No pets.      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  HENT: Negative for congestion.   Eyes: Negative for visual disturbance.  Respiratory: Negative for cough, chest tightness and shortness of breath.   Cardiovascular: Negative for chest pain, palpitations and leg swelling.  Gastrointestinal: Negative for abdominal pain, diarrhea and constipation.  Genitourinary: Negative for dysuria.  Musculoskeletal: Positive for arthralgias. Negative for myalgias, gait problem and neck stiffness.  Skin: Negative for color change and rash.  Hematological: Negative for adenopathy. Does not bruise/bleed easily.  Psychiatric/Behavioral: Negative for sleep disturbance and dysphoric mood. The patient is not nervous/anxious.        Objective:    BP 119/70 mmHg  Pulse 79  Temp(Src) 97.5 F (36.4 C) (Oral)  Ht 5\' 3"  (1.6 m)  Wt 112 lb (50.803 kg)  BMI 19.84 kg/m2  SpO2 100% Physical Exam  Constitutional: She is oriented to person, place, and time. She appears well-developed and well-nourished. No distress.  HENT:  Head: Normocephalic and atraumatic.  Right Ear: External ear normal.  Left Ear: External ear normal.  Nose: Nose normal.  Mouth/Throat:  Oropharynx is clear and moist. No oropharyngeal exudate.  Eyes: Conjunctivae are normal. Pupils are equal, round, and reactive to light. Right eye exhibits no discharge. Left eye exhibits no discharge. No scleral icterus.  Neck: Normal range of motion. Neck supple. No tracheal deviation present. No thyromegaly present.  Cardiovascular: Normal rate, regular rhythm, normal heart sounds and intact distal pulses.  Exam reveals no gallop and no friction rub.   No murmur heard. Pulmonary/Chest: Effort normal and breath sounds normal. No respiratory distress. She has no wheezes. She has no rales. She exhibits no tenderness.  Musculoskeletal: Normal range of motion. She exhibits no edema or tenderness.  Lymphadenopathy:    She has no  cervical adenopathy.  Neurological: She is alert and oriented to person, place, and time. No cranial nerve deficit. She exhibits normal muscle tone. Coordination normal.  Skin: Skin is warm and dry. No rash noted. She is not diaphoretic. No erythema. No pallor.  Psychiatric: She has a normal mood and affect. Her behavior is normal. Judgment and thought content normal.          Assessment & Plan:   Problem List Items Addressed This Visit      Unprioritized   Bunion, left foot - Primary    Bilateral bunions. Plans for left bunionectomy.       Preoperative examination    General exam normal today. EKG showed NSR. Recent labs normal. Low risk of perioperative cardiac events. Recommend proceed with surgery. She has already held her Meloxicam.      Relevant Orders   EKG 12-Lead (Completed)       Return if symptoms worsen or fail to improve.

## 2015-08-28 NOTE — Assessment & Plan Note (Signed)
Bilateral bunions. Plans for left bunionectomy.

## 2015-08-28 NOTE — Progress Notes (Signed)
Pre visit review using our clinic review tool, if applicable. No additional management support is needed unless otherwise documented below in the visit note. 

## 2015-08-28 NOTE — Assessment & Plan Note (Addendum)
General exam normal today. EKG showed NSR. Recent labs normal. Low risk of perioperative cardiac events. Recommend proceed with surgery. She has already held her Meloxicam.

## 2015-08-28 NOTE — Patient Instructions (Signed)
Recommend to proceed with surgery.  Please follow up after surgery as needed.

## 2015-09-01 NOTE — Discharge Instructions (Signed)
[facility] °[facility] ° °POST OPERATIVE INSTRUCTIONS FOR DR. TROXLER AND DR. FOWLER °[facility] ° ° °1. Take your medication as prescribed.  Pain medication should be taken only as needed. ° °2. Keep the dressing clean, dry and intact. ° °3. Keep your foot elevated above the heart level for the first 48 hours. ° °4. Walking to the bathroom and brief periods of walking are acceptable, unless we have instructed you to be non-weight bearing. ° °5. Always wear your post-op shoe when walking.  Always use your crutches if you are to be non-weight bearing. ° °6. Do not take a shower. Baths are permissible as long as the foot is kept out of the water.  ° °7. Every hour you are awake:  °- Bend your knee 15 times. °- Flex foot 15 times °- Massage calf 15 times ° °8. Call [facility] ([phone]) if any of the following problems occur: °- You develop a temperature or fever. °- The bandage becomes saturated with blood. °- Medication does not stop your pain. °- Injury of the foot occurs. °- Any symptoms of infection including redness, odor, or red streaks running from wound. ° °General Anesthesia, Adult, Care After °Refer to this sheet in the next few weeks. These instructions provide you with information on caring for yourself after your procedure. Your health care provider may also give you more specific instructions. Your treatment has been planned according to current medical practices, but problems sometimes occur. Call your health care provider if you have any problems or questions after your procedure. °WHAT TO EXPECT AFTER THE PROCEDURE °After the procedure, it is typical to experience: °· Sleepiness. °· Nausea and vomiting. °HOME CARE INSTRUCTIONS °· For the first 24 hours after general anesthesia: °¨ Have a responsible person with you. °¨ Do not drive a car. If you are alone, do not take public transportation. °¨ Do not drink alcohol. °¨ Do not take  medicine that has not been prescribed by your health care provider. °¨ Do not sign important papers or make important decisions. °¨ You may resume a normal diet and activities as directed by your health care provider. °· Change bandages (dressings) as directed. °· If you have questions or problems that seem related to general anesthesia, call the hospital and ask for the anesthetist or anesthesiologist on call. °SEEK MEDICAL CARE IF: °· You have nausea and vomiting that continue the day after anesthesia. °· You develop a rash. °SEEK IMMEDIATE MEDICAL CARE IF:  °· You have difficulty breathing. °· You have chest pain. °· You have any allergic problems. °  °This information is not intended to replace advice given to you by your health care provider. Make sure you discuss any questions you have with your health care provider. °  °Document Released: 02/07/2001 Document Revised: 11/22/2014 Document Reviewed: 03/01/2012 °Elsevier Interactive Patient Education ©2016 [employer] ° °

## 2015-09-03 ENCOUNTER — Ambulatory Visit: Payer: Medicare Other | Admitting: Anesthesiology

## 2015-09-03 ENCOUNTER — Encounter
Admission: RE | Disposition: A | Discharge status: Home / Self Care | Payer: Self-pay | Source: Ambulatory Visit | Attending: Podiatry

## 2015-09-03 ENCOUNTER — Ambulatory Visit
Admission: RE | Admit: 2015-09-03 | Discharge: 2015-09-03 | Disposition: A | Discharge status: Home / Self Care | Payer: Medicare Other | Source: Ambulatory Visit | Attending: Podiatry | Admitting: Podiatry

## 2015-09-03 ENCOUNTER — Encounter: Payer: Self-pay | Admitting: Anesthesiology

## 2015-09-03 DIAGNOSIS — Z7982 Long term (current) use of aspirin: Secondary | ICD-10-CM | POA: Insufficient documentation

## 2015-09-03 DIAGNOSIS — M2042 Other hammer toe(s) (acquired), left foot: Secondary | ICD-10-CM | POA: Diagnosis not present

## 2015-09-03 DIAGNOSIS — M2012 Hallux valgus (acquired), left foot: Secondary | ICD-10-CM | POA: Diagnosis not present

## 2015-09-03 DIAGNOSIS — M542 Cervicalgia: Secondary | ICD-10-CM | POA: Insufficient documentation

## 2015-09-03 DIAGNOSIS — M205X2 Other deformities of toe(s) (acquired), left foot: Secondary | ICD-10-CM | POA: Diagnosis not present

## 2015-09-03 DIAGNOSIS — Z791 Long term (current) use of non-steroidal anti-inflammatories (NSAID): Secondary | ICD-10-CM | POA: Diagnosis not present

## 2015-09-03 DIAGNOSIS — Z8261 Family history of arthritis: Secondary | ICD-10-CM | POA: Diagnosis not present

## 2015-09-03 DIAGNOSIS — Z823 Family history of stroke: Secondary | ICD-10-CM | POA: Diagnosis not present

## 2015-09-03 DIAGNOSIS — Z8249 Family history of ischemic heart disease and other diseases of the circulatory system: Secondary | ICD-10-CM | POA: Diagnosis not present

## 2015-09-03 HISTORY — PX: HAMMER TOE SURGERY: SHX385

## 2015-09-03 HISTORY — DX: Other hammer toe(s) (acquired), unspecified foot: M20.40

## 2015-09-03 HISTORY — DX: Anemia, unspecified: D64.9

## 2015-09-03 HISTORY — DX: Cervicalgia: M54.2

## 2015-09-03 HISTORY — PX: CORRECTION OVERLAPPING TOES: SHX6615

## 2015-09-03 HISTORY — DX: Torticollis: M43.6

## 2015-09-03 HISTORY — PX: HALLUX VALGUS LAPIDUS: SHX6626

## 2015-09-03 SURGERY — BUNIONECTOMY, LAPIDUS
Anesthesia: Monitor Anesthesia Care | Laterality: Left | Wound class: Clean

## 2015-09-03 MED ORDER — LACTATED RINGERS IV SOLN
INTRAVENOUS | Status: DC
  Administered 2015-09-03 (×2): via INTRAVENOUS

## 2015-09-03 MED ORDER — ROPIVACAINE HCL 5 MG/ML IJ SOLN
INTRAMUSCULAR | Status: DC | PRN
  Administered 2015-09-03: 32 mL via PERINEURAL

## 2015-09-03 MED ORDER — LIDOCAINE HCL (CARDIAC) 20 MG/ML IV SOLN
INTRAVENOUS | Status: DC | PRN
  Administered 2015-09-03: 50 mg via INTRATRACHEAL

## 2015-09-03 MED ORDER — PROPOFOL 10 MG/ML IV BOLUS
INTRAVENOUS | Status: DC | PRN
  Administered 2015-09-03: 150 mg via INTRAVENOUS

## 2015-09-03 MED ORDER — OXYCODONE HCL 5 MG PO TABS: 5.0000 mg | ORAL_TABLET | Freq: Once | ORAL | Status: DC | PRN

## 2015-09-03 MED ORDER — DEXAMETHASONE SODIUM PHOSPHATE 4 MG/ML IJ SOLN
INTRAMUSCULAR | Status: DC | PRN
Start: 2015-09-03 — End: 2015-09-03
  Administered 2015-09-03: 4 mg via INTRAVENOUS

## 2015-09-03 MED ORDER — ROPIVACAINE HCL 5 MG/ML IJ SOLN: INTRAMUSCULAR | Status: DC | PRN

## 2015-09-03 MED ORDER — EPHEDRINE SULFATE 50 MG/ML IJ SOLN
INTRAMUSCULAR | Status: DC | PRN
  Administered 2015-09-03 (×2): 5 mg via INTRAVENOUS

## 2015-09-03 MED ORDER — PHENYLEPHRINE HCL 10 MG/ML IJ SOLN
INTRAMUSCULAR | Status: DC | PRN
  Administered 2015-09-03 (×4): 50 ug via INTRAVENOUS
  Administered 2015-09-03: 100 ug via INTRAVENOUS

## 2015-09-03 MED ORDER — ONDANSETRON HCL 4 MG/2ML IJ SOLN
INTRAMUSCULAR | Status: DC | PRN
  Administered 2015-09-03: 4 mg via INTRAVENOUS

## 2015-09-03 MED ORDER — CEFAZOLIN SODIUM-DEXTROSE 2-3 GM-% IV SOLR
2.0000 g | Freq: Once | INTRAVENOUS | Status: AC
  Administered 2015-09-03: 2 g via INTRAVENOUS

## 2015-09-03 MED ORDER — HYDROCODONE-ACETAMINOPHEN 5-325 MG PO TABS: 1.0000 | ORAL_TABLET | Freq: Four times a day (QID) | ORAL | Status: DC | PRN

## 2015-09-03 MED ORDER — OXYCODONE HCL 5 MG/5ML PO SOLN: 5.0000 mg | Freq: Once | ORAL | Status: DC | PRN

## 2015-09-03 MED ORDER — GLYCOPYRROLATE 0.2 MG/ML IJ SOLN
INTRAMUSCULAR | Status: DC | PRN
  Administered 2015-09-03: 0.1 mg via INTRAVENOUS

## 2015-09-03 MED ORDER — MIDAZOLAM HCL 5 MG/5ML IJ SOLN
INTRAMUSCULAR | Status: DC | PRN
  Administered 2015-09-03: 2 mg via INTRAVENOUS

## 2015-09-03 MED ORDER — BUPIVACAINE HCL (PF) 0.25 % IJ SOLN
INTRAMUSCULAR | Status: DC | PRN
  Administered 2015-09-03: 10 mL

## 2015-09-03 SURGICAL SUPPLY — 53 items
BENZOIN TINCTURE PRP APPL 2/3 (GAUZE/BANDAGES/DRESSINGS) ×2 IMPLANT
BLADE MED AGGRESSIVE (BLADE) IMPLANT
BLADE MINI RND TIP GREEN BEAV (BLADE) ×2 IMPLANT
BLADE OSC/SAGITTAL 5.5X25 (BLADE) IMPLANT
BLADE OSC/SAGITTAL MD 5.5X18 (BLADE) ×2 IMPLANT
BLADE OSC/SAGITTAL MD 9X18.5 (BLADE) IMPLANT
BLADE SURG 15 STRL LF DISP TIS (BLADE) ×2 IMPLANT
BLADE SURG 15 STRL SS (BLADE) ×2
BNDG ESMARK 4X12 TAN STRL LF (GAUZE/BANDAGES/DRESSINGS) ×2 IMPLANT
BNDG GAUZE 4.5X4.1 6PLY STRL (MISCELLANEOUS) ×2 IMPLANT
BNDG STRETCH 4X75 STRL LF (GAUZE/BANDAGES/DRESSINGS) ×2 IMPLANT
CANISTER SUCT 1200ML W/VALVE (MISCELLANEOUS) ×2 IMPLANT
CONTROL 360 (Bone Implant) ×2 IMPLANT
COVER PIN YLW 0.028-062 (MISCELLANEOUS) IMPLANT
CUFF TOURNIQUET DUAL PORT 18X3 (MISCELLANEOUS) ×2 IMPLANT
DRAPE FLUOR MINI C-ARM 54X84 (DRAPES) ×2 IMPLANT
DURAPREP 26ML APPLICATOR (WOUND CARE) ×2 IMPLANT
GAUZE PETRO XEROFOAM 1X8 (MISCELLANEOUS) ×2 IMPLANT
GAUZE PETRO XEROFOAM 5X9 (MISCELLANEOUS) IMPLANT
GAUZE SPONGE 4X4 12PLY STRL (GAUZE/BANDAGES/DRESSINGS) ×2 IMPLANT
GLOVE BIO SURGEON STRL SZ7.5 (GLOVE) ×4 IMPLANT
GLOVE INDICATOR 8.0 STRL GRN (GLOVE) ×4 IMPLANT
GOWN STRL REUS W/ TWL LRG LVL3 (GOWN DISPOSABLE) ×2 IMPLANT
GOWN STRL REUS W/TWL LRG LVL3 (GOWN DISPOSABLE) ×2
K-WIRE (Pin) ×2 IMPLANT
K-WIRE DBL END TROCAR 6X.045 (WIRE)
K-WIRE DBL END TROCAR 6X.062 (WIRE)
KWIRE DBL END TROCAR 6X.045 (WIRE) IMPLANT
KWIRE DBL END TROCAR 6X.062 (WIRE) IMPLANT
NS IRRIG 500ML POUR BTL (IV SOLUTION) ×2 IMPLANT
OMEGA SURGICAL INSTRUMENTS ×2 IMPLANT
PACK EXTREMITY ARMC (MISCELLANEOUS) ×2 IMPLANT
PAD GROUND ADULT SPLIT (MISCELLANEOUS) ×2 IMPLANT
RASP SM TEAR CROSS CUT (RASP) ×2 IMPLANT
SPLINT CAST 1 STEP 4X30 (MISCELLANEOUS) IMPLANT
STOCKINETTE STRL 6IN 960660 (GAUZE/BANDAGES/DRESSINGS) ×2 IMPLANT
STRIP CLOSURE SKIN 1/4X4 (GAUZE/BANDAGES/DRESSINGS) ×2 IMPLANT
SUT ETHILON 4-0 (SUTURE) ×2
SUT ETHILON 4-0 FS2 18XMFL BLK (SUTURE) ×2
SUT ETHILON 5-0 FS-2 18 BLK (SUTURE) IMPLANT
SUT MNCRL 4-0 (SUTURE)
SUT MNCRL 4-0 27XMFL (SUTURE)
SUT MNCRL 5-0+ PC-1 (SUTURE) ×1 IMPLANT
SUT MONOCRYL 5-0 (SUTURE) ×1
SUT VIC AB 0 CT1 36 (SUTURE) IMPLANT
SUT VIC AB 2-0 SH 27 (SUTURE)
SUT VIC AB 2-0 SH 27XBRD (SUTURE) IMPLANT
SUT VIC AB 3-0 SH 27 (SUTURE)
SUT VIC AB 3-0 SH 27X BRD (SUTURE) IMPLANT
SUT VIC AB 4-0 FS2 27 (SUTURE) ×2 IMPLANT
SUT VICRYL AB 3-0 FS1 BRD 27IN (SUTURE) ×2 IMPLANT
SUTURE ETHLN 4-0 FS2 18XMF BLK (SUTURE) ×2 IMPLANT
SUTURE MNCRL 4-0 27XMF (SUTURE) IMPLANT

## 2015-09-03 NOTE — H&P (Signed)
  HISTORY AND PHYSICAL INTERVAL NOTE:  09/03/2015  8:34 AM  Robyn House  has presented today for surgery, with the diagnosis of M20.12 HALLUX VALGUS LEFT FOOT M20.42 HAMMERTOE LEFT 2ND M20.5X2 OVERLAPPING TOE LEFT 2ND.  The various methods of treatment have been discussed with the patient.  No guarantees were given.  After consideration of risks, benefits and other options for treatment, the patient has consented to surgery.  I have reviewed the patients' chart and labs.    Patient Vitals for the past 24 hrs:  BP Temp Temp src Pulse SpO2 Height Weight  09/03/15 0754 - 98.1 F (36.7 C) Tympanic - - 5\' 3"  (1.6 m) 50.349 kg (111 lb)  09/03/15 0747 (!) 128/59 mmHg - - 65 100 % - -    A history and physical examination was performed in my office.  The patient was reexamined.  There have been no changes to this history and physical examination.  Robyn House A

## 2015-09-03 NOTE — Anesthesia Procedure Notes (Addendum)
Anesthesia Regional Block:  Popliteal block  Pre-Anesthetic Checklist: ,, timeout performed, Correct Patient, Correct Site, Correct Laterality, Correct Procedure, Correct Position, site marked, Risks and benefits discussed,  Surgical consent,  Pre-op evaluation,  At surgeon's request and post-op pain management  Laterality: Left  Prep: chloraprep       Needles:  Injection technique: Single-shot  Needle Type: Stimiplex     Needle Length: 9cm 9 cm Needle Gauge: 21 and 21 G    Additional Needles:  Procedures: ultrasound guided (picture in chart) Popliteal block Narrative:  Start time: 09/03/2015 8:12 AM End time: 09/03/2015 8:18 AM Injection made incrementally with aspirations every 5 mL.  Performed by: Personally  Anesthesiologist: Elgie Collard  Additional Notes: Functioning IV was confirmed and monitors applied. Ultrasound guidance: relevant anatomy identified, needle position confirmed, local anesthetic spread visualized around nerve(s)., vascular puncture avoided.  Image printed for medical record.  Negative aspiration and no paresthesias; incremental administration of local anesthetic. The patient tolerated the procedure well. Vitals signes recorded in RN notes.   Procedure Name: LMA Insertion Date/Time: 09/03/2015 9:05 AM Performed by: Mayme Genta Pre-anesthesia Checklist: Patient identified, Emergency Drugs available, Suction available, Timeout performed and Patient being monitored Patient Re-evaluated:Patient Re-evaluated prior to inductionOxygen Delivery Method: Circle system utilized Preoxygenation: Pre-oxygenation with 100% oxygen Intubation Type: IV induction LMA: LMA inserted LMA Size: 3.0 Number of attempts: 1 Placement Confirmation: positive ETCO2 and breath sounds checked- equal and bilateral Tube secured with: Tape

## 2015-09-03 NOTE — Anesthesia Postprocedure Evaluation (Signed)
  Anesthesia Post-op Note  Patient: Robyn House  Procedure(s) Performed: Procedure(s) with comments: HALLUX VALGUS LAPIDUS LEFT FOOT (Left) - WITH POPLITEAL 2ND CASE PER SCHEDULING SHEET HAMMER TOE CORRECTION LEFT SECOND TOE (Left) CORRECTION OVERLAPPING TOE LEFT SECOND TOE (Left)  Anesthesia type:MAC, Regional  Patient location: PACU  Post pain: Pain level controlled  Post assessment: Post-op Vital signs reviewed, Patient's Cardiovascular Status Stable, Respiratory Function Stable, Patent Airway and No signs of Nausea or vomiting  Post vital signs: Reviewed and stable  Last Vitals:  Filed Vitals:   09/03/15 1200  BP: 119/63  Pulse: 90  Temp:   Resp: 22    Level of consciousness: awake, alert  and patient cooperative  Complications: No apparent anesthesia complications

## 2015-09-03 NOTE — Transfer of Care (Signed)
Immediate Anesthesia Transfer of Care Note  Patient: Robyn House  Procedure(s) Performed: Procedure(s) with comments: HALLUX VALGUS LAPIDUS LEFT FOOT (Left) - WITH POPLITEAL 2ND CASE PER SCHEDULING SHEET HAMMER TOE CORRECTION LEFT SECOND TOE (Left) CORRECTION OVERLAPPING TOE LEFT SECOND TOE (Left)  Patient Location: PACU  Anesthesia Type: MAC, Regional  Level of Consciousness: awake, alert  and patient cooperative  Airway and Oxygen Therapy: Patient Spontanous Breathing and Patient connected to supplemental oxygen  Post-op Assessment: Post-op Vital signs reviewed, Patient's Cardiovascular Status Stable, Respiratory Function Stable, Patent Airway and No signs of Nausea or vomiting  Post-op Vital Signs: Reviewed and stable  Complications: No apparent anesthesia complications

## 2015-09-03 NOTE — Progress Notes (Signed)
Assisted Kandice Robinsons ANMD with left, ultrasound guided, popliteal block. Side rails up, monitors on throughout procedure. See vital signs in flow sheet. Tolerated Procedure well.

## 2015-09-03 NOTE — Anesthesia Preprocedure Evaluation (Addendum)
Anesthesia Evaluation  Patient identified by MRN, date of birth, ID band Patient awake    Reviewed: Allergy & Precautions, H&P , NPO status , Patient's Chart, lab work & pertinent test results  History of Anesthesia Complications Negative for: history of anesthetic complications  Airway Mallampati: I  TM Distance: >3 FB Neck ROM: full    Dental no notable dental hx.    Pulmonary neg pulmonary ROS,    Pulmonary exam normal        Cardiovascular negative cardio ROS Normal cardiovascular exam     Neuro/Psych    GI/Hepatic negative GI ROS, Neg liver ROS,   Endo/Other  negative endocrine ROS  Renal/GU negative Renal ROS     Musculoskeletal   Abdominal   Peds  Hematology negative hematology ROS (+)   Anesthesia Other Findings   Reproductive/Obstetrics                            Anesthesia Physical Anesthesia Plan  ASA: I  Anesthesia Plan: MAC and Regional   Post-op Pain Management: GA combined w/ Regional for post-op pain   Induction:   Airway Management Planned:   Additional Equipment:   Intra-op Plan:   Post-operative Plan:   Informed Consent: I have reviewed the patients History and Physical, chart, labs and discussed the procedure including the risks, benefits and alternatives for the proposed anesthesia with the patient or authorized representative who has indicated his/her understanding and acceptance.     Plan Discussed with: CRNA  Anesthesia Plan Comments:        Anesthesia Quick Evaluation

## 2015-09-03 NOTE — Op Note (Signed)
Operative note   Surgeon:Chiante Peden Lawyer: None    Preop diagnosis:1.  Left foot hallux valgus  2. Left second toe hammertoe  3.  Overlapping left second toe    Postop diagnosis: Same    Procedure:1. Lapidus hallux valgus correction left foot  2. Hammertoe repair left second toe  3. Repair of overlapping second toe.    EBL: Minimal    Anesthesia:regional and general    Hemostasis: Ankle tourniquet inflated to 250 mmHg for 120 minutes    Specimen: None    Complications: None    Operative indications: 64 year old female with complaint of a painful left foot hallux valgus and hammertoe deformity. Undergone conservative treatment and presents today for surgery. The risks benefits alternatives and competitions have been discussed and consent has been given.    Procedure:  Patient was brought into the OR and placed on the operating table in thesupine position. After anesthesia was obtained theleft lower extremity was prepped and draped in usual sterile fashion.  Initial incision was carried over the first met cuneiform joint. Sharp and blunt dissection was carried down to the cuneiform joint. The dorsomedial and lateral aspect of the middle cuneiform joint was then exposed. Extreme epinephrine drawer was then entered and the soft tissue was freed from the medial lateral and plantar aspect. Performed with a power saw as well as a osteotome. Good mobility was then noted. A pin was placed in the medial aspect of the first metatarsal. I was able to fully rotate the metatarsal on the cuneiforms approximate 20-25 in rotation. There is some residual positional issues with the MTPJ. I was not able to fully correct this with the rotation. An initial lateral release was placed. A small stab incision was made lateral to the first MTPJ. The lateral capsule was then released at this time. Attention was then redirected to the base at the first met cuneiform joint. A Lapidus positioning stabilizer  from theTreace tray was brought in. I was able to rotate and position the first metatarsal in line with the second metatarsal. Was then initially stabilized with a guidewire through the jig. The positioning jig was placed along the mid shaft of the first metatarsal and a stab incision had been made along the lateral aspect of the second metatarsal to hold this at this time after good rotation was noted and good alignment was noted on fluoroscopy. The joint seeker was placed into the met cuneiform joint. Jig for the osteotomy cuts was then placed overlying this area. Good alignment was noted along the first met cuneiform. This was felt to be in good position next the osteotomy was created removing the base of the first metatarsal and distal aspect of the medial cuneiform. Arledge was removed down to subchondral bone. Chondral drilling was placed. Prior to drilling this was lysed with copious amounts of irrigation. After the subchondral drilling was performed and alignment was noted. The jig was then removed and no compression was applied. Good alignment was noted. I was able to rotate the make him a form joint. Into a good position. Fluoroscopy revealed good alignment of the intermetatarsal angle and positioning of the sesamoids. Next 2 compression pins with all wires were placed crossing the met cuneiform joint. The 2 docking 2.5 mm plates were placed dorsal was lateral. Excellent stability was noted with good alignment. All views of fluoroscopy revealed good compression in all planes. At this time all of the tendons were removed. The incision was further carried out  distal to the MTPJ. A medial capsulorrhaphy was performed initially. The metatarsal was exposed with a prominent dorsal medial eminence. Transected with a power saw and smoothed with a power rasp. The capsulorrhaphy was performed with good alignment of the MTPJ. The capsule was closed with a 3-0 Vicryl. The subcutaneous tissue with a 4-0 Vicryl and the  skin with a 5-0 Monocryl.  The hammertoe repair was then performed. A curvilinear incision was carried from the met cuneiform joint to the PIPJ. A blunt dissection carried down to the long extensor tendon. This was then transected. The head of the proximal phalanx and base of the middle phalanx were removed with a power saw. Residual malalignment of the second toe with overlapping was still noted. The long extensor tendon was then reflected proximal to the MTPJ. A dorsal medial and lateral capsulotomy was then performed. Still residual deformity was noted and the joint released with a McGlamry elevator. Markedly improved alignment was noted. At this time the PIPJ was then stabilized with a 0.045 K wire. This was taken across the PIPJ and also across the MTPJ with the second toe in good position. All areas then flushed. Good alignment was noted. The extensor tendon was reapproximated with a 4-0 Vicryl as well as subcutaneous tissue. The skin reapproximated with nylon. A well compressive sterile dressing was placed. A saphenous block was placed for postoperative pain management. She was then placed in her equalizer walker boot.    Patient tolerated the procedure and anesthesia well.  Was transported from the OR to the PACU with all vital signs stable and vascular status intact. To be discharged per routine protocol.  Will follow up in approximately 1 week in the outpatient clinic.

## 2015-09-05 ENCOUNTER — Encounter: Payer: Self-pay | Admitting: Podiatry

## 2015-09-08 ENCOUNTER — Encounter: Payer: Self-pay | Admitting: Podiatry

## 2015-09-09 DIAGNOSIS — M79671 Pain in right foot: Secondary | ICD-10-CM | POA: Diagnosis not present

## 2015-09-25 ENCOUNTER — Ambulatory Visit: Payer: 59

## 2015-09-30 DIAGNOSIS — M79672 Pain in left foot: Secondary | ICD-10-CM | POA: Diagnosis not present

## 2015-10-08 DIAGNOSIS — M79672 Pain in left foot: Secondary | ICD-10-CM | POA: Diagnosis not present

## 2015-11-04 DIAGNOSIS — M79672 Pain in left foot: Secondary | ICD-10-CM | POA: Diagnosis not present

## 2015-11-05 ENCOUNTER — Other Ambulatory Visit: Payer: Self-pay | Admitting: Internal Medicine

## 2015-11-05 ENCOUNTER — Ambulatory Visit
Admission: RE | Admit: 2015-11-05 | Discharge: 2015-11-05 | Disposition: A | Discharge status: Home / Self Care | Payer: Medicare Other | Source: Ambulatory Visit | Attending: Internal Medicine | Admitting: Internal Medicine

## 2015-11-05 DIAGNOSIS — Z1231 Encounter for screening mammogram for malignant neoplasm of breast: Secondary | ICD-10-CM | POA: Insufficient documentation

## 2015-11-05 DIAGNOSIS — Z780 Asymptomatic menopausal state: Secondary | ICD-10-CM | POA: Diagnosis not present

## 2015-11-05 DIAGNOSIS — M8580 Other specified disorders of bone density and structure, unspecified site: Secondary | ICD-10-CM

## 2015-11-05 DIAGNOSIS — Z1239 Encounter for other screening for malignant neoplasm of breast: Secondary | ICD-10-CM

## 2015-11-19 DIAGNOSIS — M47812 Spondylosis without myelopathy or radiculopathy, cervical region: Secondary | ICD-10-CM | POA: Diagnosis not present

## 2015-11-19 DIAGNOSIS — M542 Cervicalgia: Secondary | ICD-10-CM | POA: Diagnosis not present

## 2015-11-19 DIAGNOSIS — R030 Elevated blood-pressure reading, without diagnosis of hypertension: Secondary | ICD-10-CM | POA: Diagnosis not present

## 2015-12-05 DIAGNOSIS — M542 Cervicalgia: Secondary | ICD-10-CM | POA: Diagnosis not present

## 2015-12-09 DIAGNOSIS — M542 Cervicalgia: Secondary | ICD-10-CM | POA: Diagnosis not present

## 2015-12-11 DIAGNOSIS — M542 Cervicalgia: Secondary | ICD-10-CM | POA: Diagnosis not present

## 2015-12-17 DIAGNOSIS — M542 Cervicalgia: Secondary | ICD-10-CM | POA: Diagnosis not present

## 2015-12-18 DIAGNOSIS — M2012 Hallux valgus (acquired), left foot: Secondary | ICD-10-CM | POA: Diagnosis not present

## 2015-12-23 DIAGNOSIS — M542 Cervicalgia: Secondary | ICD-10-CM | POA: Diagnosis not present

## 2015-12-30 DIAGNOSIS — M542 Cervicalgia: Secondary | ICD-10-CM | POA: Diagnosis not present

## 2016-01-06 DIAGNOSIS — M542 Cervicalgia: Secondary | ICD-10-CM | POA: Diagnosis not present

## 2016-01-12 DIAGNOSIS — M2012 Hallux valgus (acquired), left foot: Secondary | ICD-10-CM | POA: Diagnosis not present

## 2016-01-12 DIAGNOSIS — M79672 Pain in left foot: Secondary | ICD-10-CM | POA: Diagnosis not present

## 2016-01-13 ENCOUNTER — Encounter: Payer: Self-pay | Admitting: Internal Medicine

## 2016-01-13 ENCOUNTER — Ambulatory Visit (INDEPENDENT_AMBULATORY_CARE_PROVIDER_SITE_OTHER)
Admission: RE | Admit: 2016-01-13 | Discharge: 2016-01-13 | Disposition: A | Discharge status: Home / Self Care | Payer: Medicare Other | Source: Ambulatory Visit | Attending: Internal Medicine | Admitting: Internal Medicine

## 2016-01-13 ENCOUNTER — Ambulatory Visit (INDEPENDENT_AMBULATORY_CARE_PROVIDER_SITE_OTHER): Payer: Medicare Other | Admitting: Internal Medicine

## 2016-01-13 ENCOUNTER — Other Ambulatory Visit: Payer: Self-pay | Admitting: Internal Medicine

## 2016-01-13 VITALS — BP 110/60 | HR 92 | Temp 98.2°F | Resp 18 | Wt 110.8 lb

## 2016-01-13 DIAGNOSIS — R059 Cough, unspecified: Secondary | ICD-10-CM

## 2016-01-13 DIAGNOSIS — R0602 Shortness of breath: Secondary | ICD-10-CM | POA: Diagnosis not present

## 2016-01-13 DIAGNOSIS — R05 Cough: Secondary | ICD-10-CM

## 2016-01-13 DIAGNOSIS — R509 Fever, unspecified: Secondary | ICD-10-CM | POA: Diagnosis not present

## 2016-01-13 DIAGNOSIS — R52 Pain, unspecified: Secondary | ICD-10-CM | POA: Diagnosis not present

## 2016-01-13 LAB — POCT INFLUENZA A/B
Influenza A, POC: NEGATIVE
Influenza B, POC: NEGATIVE

## 2016-01-13 MED ORDER — LEVOFLOXACIN 500 MG PO TABS: 500.0000 mg | ORAL_TABLET | Freq: Every day | ORAL | Status: DC

## 2016-01-13 NOTE — Patient Instructions (Signed)

## 2016-01-13 NOTE — Addendum Note (Signed)
Addended by: Lurlean Nanny on: 01/13/2016 01:50 PM   Modules accepted: Orders

## 2016-01-13 NOTE — Addendum Note (Signed)
Addended by: Lurlean Nanny on: 01/13/2016 03:48 PM   Modules accepted: Orders

## 2016-01-13 NOTE — Progress Notes (Signed)
HPI  Pt presents to the clinic today with c/o fever, chills, cough and chest congestion. This started 3 days ago. The cough is productive of thick yellow mucous. She denies shortness of breath.  She has run fevers up to 102, had chills and body aches. She has taken Tylenol with some relief. She has no history of allergies or breathing problems. She has had sick contacts. She did not get her flu shot.   Review of Systems      Past Medical History  Diagnosis Date  . Depression   . Anorexia     college  . Hammertoe     LEFT 2ND  . Cervicalgia     Had MRI, seeing neurosurgeon 09/22/15  . Stiff neck     limited turn to left  . Anemia     distant past    Family History  Problem Relation Age of Onset  . Stroke Mother   . Heart disease Mother     arrhythmia  . Stroke Father   . Arthritis Maternal Grandmother   . Heart disease Maternal Grandfather     Social History   Social History  . Marital Status: Married    Spouse Name: N/A  . Number of Children: N/A  . Years of Education: N/A   Occupational History  . Not on file.   Social History Main Topics  . Smoking status: Never Smoker   . Smokeless tobacco: Never Used  . Alcohol Use: 4.2 oz/week    7 Glasses of wine per week  . Drug Use: No  . Sexual Activity: Not on file   Other Topics Concern  . Not on file   Social History Narrative   Lives in [place] with husband. Has 3 children and 1 step son, 86, 1, 54, 3. 1 son in [place], step son in [place]. 2 sons in [place]. No pets.      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 Denies headache, abrupt weight changes.  HEENT:  Denies eye redness, eye pain, pressure behind the eyes, facial pain, nasal congestion, ear pain, ringing in the ears, wax buildup, runny nose or sore throat. Respiratory: Positive cough.  Denies difficulty breathing or shortness of breath.  Cardiovascular: Denies chest pain, chest tightness, palpitations or swelling in the hands or feet.   No other specific complaints in a complete review of systems (except as listed in HPI above).  Objective:   BP 110/60 mmHg  Pulse 92  Temp(Src) 98.2 F (36.8 C)  Resp 18  Wt 110 lb 12.8 oz (50.259 kg)  SpO2 100%  Wt Readings from Last 3 Encounters:  09/03/15 111 lb (50.349 kg)  08/28/15 112 lb (50.803 kg)  07/11/15 111 lb (50.349 kg)     General: Appears her stated age, ill appearing in NAD. HEENT: Head: normal shape and size, no sinus tenderness noted; Eyes: sclera white, no icterus, conjunctiva pink; Ears: Tm's gray and intact, normal light reflex; Throat/Mouth: Teeth present, mucosa pink and moist, no exudate noted, no lesions or ulcerations noted.  Neck: No cervical lymphadenopathy.  Cardiovascular: Normal rate and rhythm. S1,S2 noted.  No murmur, rubs or gallops noted.  Pulmonary/Chest: Normal effort and rhonchi/rub noted in RML/RLL. No respiratory distress. No wheezes noted     Assessment & Plan:  Cough, fever and body aches:  Rapid Flu: negative Chest xray to r/o PNA- abx only if infiltrate noted on exam Get some rest and drink plenty of water Tylenol/Iburprofen for fevers and body aches Delsym cough syrup  Will follow up after chest xray, RTC as needed or if symptoms persist.

## 2016-01-14 DIAGNOSIS — M25572 Pain in left ankle and joints of left foot: Secondary | ICD-10-CM | POA: Diagnosis not present

## 2016-01-22 DIAGNOSIS — M25572 Pain in left ankle and joints of left foot: Secondary | ICD-10-CM | POA: Diagnosis not present

## 2016-01-26 DIAGNOSIS — M25572 Pain in left ankle and joints of left foot: Secondary | ICD-10-CM | POA: Diagnosis not present

## 2016-01-29 DIAGNOSIS — M25572 Pain in left ankle and joints of left foot: Secondary | ICD-10-CM | POA: Diagnosis not present

## 2016-02-10 DIAGNOSIS — M25572 Pain in left ankle and joints of left foot: Secondary | ICD-10-CM | POA: Diagnosis not present

## 2016-02-14 DIAGNOSIS — M25572 Pain in left ankle and joints of left foot: Secondary | ICD-10-CM | POA: Diagnosis not present

## 2016-02-20 DIAGNOSIS — M25572 Pain in left ankle and joints of left foot: Secondary | ICD-10-CM | POA: Diagnosis not present

## 2016-02-26 DIAGNOSIS — M25572 Pain in left ankle and joints of left foot: Secondary | ICD-10-CM | POA: Diagnosis not present

## 2016-03-04 DIAGNOSIS — M25572 Pain in left ankle and joints of left foot: Secondary | ICD-10-CM | POA: Diagnosis not present

## 2016-03-15 DIAGNOSIS — M25572 Pain in left ankle and joints of left foot: Secondary | ICD-10-CM | POA: Diagnosis not present

## 2016-03-18 DIAGNOSIS — M25572 Pain in left ankle and joints of left foot: Secondary | ICD-10-CM | POA: Diagnosis not present

## 2016-03-25 DIAGNOSIS — M25572 Pain in left ankle and joints of left foot: Secondary | ICD-10-CM | POA: Diagnosis not present

## 2016-04-01 DIAGNOSIS — M25572 Pain in left ankle and joints of left foot: Secondary | ICD-10-CM | POA: Diagnosis not present

## 2016-04-08 DIAGNOSIS — M25572 Pain in left ankle and joints of left foot: Secondary | ICD-10-CM | POA: Diagnosis not present

## 2016-04-15 DIAGNOSIS — M25572 Pain in left ankle and joints of left foot: Secondary | ICD-10-CM | POA: Diagnosis not present

## 2016-04-29 DIAGNOSIS — M25572 Pain in left ankle and joints of left foot: Secondary | ICD-10-CM | POA: Diagnosis not present

## 2016-06-04 DIAGNOSIS — L814 Other melanin hyperpigmentation: Secondary | ICD-10-CM | POA: Diagnosis not present

## 2016-06-04 DIAGNOSIS — L570 Actinic keratosis: Secondary | ICD-10-CM | POA: Diagnosis not present

## 2016-06-04 DIAGNOSIS — L821 Other seborrheic keratosis: Secondary | ICD-10-CM | POA: Diagnosis not present

## 2016-06-04 DIAGNOSIS — X32XXXA Exposure to sunlight, initial encounter: Secondary | ICD-10-CM | POA: Diagnosis not present

## 2016-06-17 ENCOUNTER — Encounter: Payer: Self-pay | Admitting: Primary Care

## 2016-06-17 ENCOUNTER — Ambulatory Visit (INDEPENDENT_AMBULATORY_CARE_PROVIDER_SITE_OTHER): Payer: Medicare Other | Admitting: Primary Care

## 2016-06-17 DIAGNOSIS — M542 Cervicalgia: Secondary | ICD-10-CM | POA: Diagnosis not present

## 2016-06-17 DIAGNOSIS — M8580 Other specified disorders of bone density and structure, unspecified site: Secondary | ICD-10-CM | POA: Diagnosis not present

## 2016-06-17 DIAGNOSIS — S91209A Unspecified open wound of unspecified toe(s) with damage to nail, initial encounter: Secondary | ICD-10-CM

## 2016-06-17 DIAGNOSIS — M21612 Bunion of left foot: Secondary | ICD-10-CM | POA: Diagnosis not present

## 2016-06-17 NOTE — Assessment & Plan Note (Signed)
Bone density completed in December 2016, osteopenia. Currently managed on vitamin D.

## 2016-06-17 NOTE — Assessment & Plan Note (Signed)
Chronic. Occasional use of Meloxicam with improvement. Completes yoga three times weekly, also completes neck exercises.

## 2016-06-17 NOTE — Assessment & Plan Note (Signed)
Repair completed in October 2016.

## 2016-06-17 NOTE — Patient Instructions (Signed)
Please schedule a physical with me later this month at your convenience. You may also schedule a lab only appointment 3-4 days prior. We will discuss your lab results in detail during your physical.  It was a pleasure to meet you today! Please don't hesitate to call me with any questions. Welcome to [facility]!

## 2016-06-17 NOTE — Assessment & Plan Note (Signed)
Repaired in October 2016 per podiatry.

## 2016-06-17 NOTE — Progress Notes (Signed)
Pre visit review using our clinic review tool, if applicable. No additional management support is needed unless otherwise documented below in the visit note. 

## 2016-06-17 NOTE — Progress Notes (Signed)
Subjective:    Patient ID: [facility]yn [facility], female    DOB: 10-10-1951, 65 y.o.   MRN: [id number]  HPI  Robyn House is a 65 year old female who presents today to trasfer care from [facility].  1) Cervicalgia: Intermittent pain mostly to the right side of her neck for years. She will take Meloxicam as needed for any discomfort with improvement. She was once evaluated by a neurologist in [place], also sent to physical therapy. She completes Yoga three days weekly and neck exercises occasionally. She's had a few flares within the last several weeks, but overall doesn't experience much discomfort.  2) Osteopenia: Previously managed on Fosamax in the past for 1.5 years. She is now managed on Vitamin D. Bone density testing completed in December 2016 with results of osteopenia. Denies recent falls.  3) Bunion/Hammer Toe Repair: Follows with podiatry. Completed in October 2016. Continues to experience discomfort occasionally. She plans to schedule a follow up appointment with her podiatrist.   Review of Systems  Respiratory: Negative for shortness of breath.   Cardiovascular: Negative for chest pain.  Musculoskeletal:       Chronic neck pain, stable  Neurological: Negative for headaches.       Past Medical History:  Diagnosis Date  . Anemia    distant past  . Anorexia    college  . Cervicalgia    Had MRI, seeing neurosurgeon 09/22/15  . Depression   . Hammertoe    LEFT 2ND  . Stiff neck    limited turn to left     Social History   Social History  . Marital status: Married    Spouse name: N/A  . Number of children: N/A  . Years of education: N/A   Occupational History  . Not on file.   Social History Main Topics  . Smoking status: Never Smoker  . Smokeless tobacco: Never Used  . Alcohol use 4.2 oz/week    7 Glasses of wine per week  . Drug use: No  . Sexual activity: Not on file   Other Topics Concern  . Not on file   Social History Narrative   Lives in  [place] with husband.    Has 3 children and 1 step son, 24, 68, 25, 41. 1 son in [place], step son in [place]. 2 sons in [place].    No pets.   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     Past Surgical History:  Procedure Laterality Date  . CORRECTION OVERLAPPING TOES Left 09/03/2015   Procedure: CORRECTION OVERLAPPING TOE LEFT SECOND TOE;  Surgeon: Samara Deist, DPM;  Location: [facility];  Service: Podiatry;  Laterality: Left;  . HALLUX VALGUS LAPIDUS Left 09/03/2015   Procedure: HALLUX VALGUS LAPIDUS LEFT FOOT;  Surgeon: Samara Deist, DPM;  Location: [facility];  Service: Podiatry;  Laterality: Left;  WITH POPLITEAL 2ND CASE PER SCHEDULING SHEET  . HAMMER TOE SURGERY Left 09/03/2015   Procedure: HAMMER TOE CORRECTION LEFT SECOND TOE;  Surgeon: Samara Deist, DPM;  Location: [facility];  Service: Podiatry;  Laterality: Left;  . INNER EAR SURGERY     Right ear drum collapsed, Dr. Tami Ribas  . SHOULDER SURGERY Right    Rotator cuff repair  . TONSILECTOMY/ADENOIDECTOMY WITH MYRINGOTOMY      Family History  Problem Relation Age of Onset  .  Stroke Mother   . Heart disease Mother     arrhythmia  . Stroke Father   . Arthritis Maternal Grandmother   . Heart disease Maternal Grandfather     No Known Allergies  Current Outpatient Prescriptions on File Prior to Visit  Medication Sig Dispense Refill  . aspirin 81 MG tablet Take 81 mg by mouth daily.    . Cholecalciferol (VITAMIN D-3 PO) Take by mouth.    . MULTIPLE VITAMINS PO Take by mouth.    . meloxicam (MOBIC) 15 MG tablet Take 15 mg by mouth daily.     No current facility-administered medications on file prior to visit.     BP 110/64   Pulse 76   Temp 97.7 F (36.5 C) (Oral)   Ht 5\' 3"  (1.6 m)   Wt 112 lb 6.4 oz (51 kg)   SpO2 96%   BMI 19.91 kg/m    Objective:    Physical Exam  Constitutional: She appears well-nourished.  Neck: Neck supple.  Cardiovascular: Normal rate and regular rhythm.   Pulmonary/Chest: Effort normal and breath sounds normal.  Skin: Skin is warm and dry.  Psychiatric: She has a normal mood and affect.          Assessment & Plan:

## 2016-07-04 ENCOUNTER — Other Ambulatory Visit: Payer: Self-pay | Admitting: Primary Care

## 2016-07-04 DIAGNOSIS — E559 Vitamin D deficiency, unspecified: Secondary | ICD-10-CM

## 2016-07-04 DIAGNOSIS — Z13220 Encounter for screening for lipoid disorders: Secondary | ICD-10-CM

## 2016-07-04 DIAGNOSIS — Z1159 Encounter for screening for other viral diseases: Secondary | ICD-10-CM

## 2016-07-04 DIAGNOSIS — R7303 Prediabetes: Secondary | ICD-10-CM

## 2016-07-08 ENCOUNTER — Other Ambulatory Visit (INDEPENDENT_AMBULATORY_CARE_PROVIDER_SITE_OTHER): Payer: Medicare Other

## 2016-07-08 DIAGNOSIS — R7303 Prediabetes: Secondary | ICD-10-CM

## 2016-07-08 DIAGNOSIS — Z1159 Encounter for screening for other viral diseases: Secondary | ICD-10-CM | POA: Diagnosis not present

## 2016-07-08 DIAGNOSIS — E559 Vitamin D deficiency, unspecified: Secondary | ICD-10-CM

## 2016-07-08 LAB — COMPREHENSIVE METABOLIC PANEL
ALT: 19 U/L (ref 0–35)
AST: 21 U/L (ref 0–37)
Albumin: 4.1 g/dL (ref 3.5–5.2)
Alkaline Phosphatase: 51 U/L (ref 39–117)
BUN: 18 mg/dL (ref 6–23)
CO2: 29 mEq/L (ref 19–32)
Calcium: 9.4 mg/dL (ref 8.4–10.5)
Chloride: 106 mEq/L (ref 96–112)
Creatinine, Ser: 0.8 mg/dL (ref 0.40–1.20)
GFR: 76.3 mL/min (ref >60.00)
Glucose, Bld: 85 mg/dL (ref 70–99)
Potassium: 4.7 mEq/L (ref 3.5–5.1)
Sodium: 140 mEq/L (ref 135–145)
Total Bilirubin: 0.4 mg/dL (ref 0.2–1.2)
Total Protein: 7.4 g/dL (ref 6.0–8.3)

## 2016-07-08 LAB — VITAMIN D 25 HYDROXY (VIT D DEFICIENCY, FRACTURES): VITD: 40.14 ng/mL (ref 30.00–100.00)

## 2016-07-08 LAB — HEMOGLOBIN A1C: Hgb A1c MFr Bld: 5.6 % (ref 4.6–6.5)

## 2016-07-09 LAB — HEPATITIS C ANTIBODY: HCV Ab: NEGATIVE

## 2016-07-15 ENCOUNTER — Encounter: Payer: 59 | Admitting: Internal Medicine

## 2016-07-15 ENCOUNTER — Ambulatory Visit (INDEPENDENT_AMBULATORY_CARE_PROVIDER_SITE_OTHER): Payer: Medicare Other | Admitting: Primary Care

## 2016-07-15 ENCOUNTER — Encounter: Payer: Self-pay | Admitting: Primary Care

## 2016-07-15 ENCOUNTER — Other Ambulatory Visit (HOSPITAL_COMMUNITY)
Admission: RE | Admit: 2016-07-15 | Discharge: 2016-07-15 | Disposition: A | Discharge status: Home / Self Care | Payer: Medicare Other | Source: Ambulatory Visit | Attending: Primary Care | Admitting: Primary Care

## 2016-07-15 VITALS — BP 104/66 | HR 60 | Temp 98.7°F | Ht 63.0 in | Wt 109.8 lb

## 2016-07-15 DIAGNOSIS — M8580 Other specified disorders of bone density and structure, unspecified site: Secondary | ICD-10-CM | POA: Diagnosis not present

## 2016-07-15 DIAGNOSIS — Z23 Encounter for immunization: Secondary | ICD-10-CM

## 2016-07-15 DIAGNOSIS — Z0000 Encounter for general adult medical examination without abnormal findings: Secondary | ICD-10-CM | POA: Diagnosis not present

## 2016-07-15 DIAGNOSIS — Z1151 Encounter for screening for human papillomavirus (HPV): Secondary | ICD-10-CM | POA: Insufficient documentation

## 2016-07-15 DIAGNOSIS — M542 Cervicalgia: Secondary | ICD-10-CM

## 2016-07-15 DIAGNOSIS — Z1239 Encounter for other screening for malignant neoplasm of breast: Secondary | ICD-10-CM | POA: Diagnosis not present

## 2016-07-15 DIAGNOSIS — Z124 Encounter for screening for malignant neoplasm of cervix: Secondary | ICD-10-CM

## 2016-07-15 DIAGNOSIS — M79672 Pain in left foot: Secondary | ICD-10-CM | POA: Diagnosis not present

## 2016-07-15 DIAGNOSIS — Z01419 Encounter for gynecological examination (general) (routine) without abnormal findings: Secondary | ICD-10-CM | POA: Insufficient documentation

## 2016-07-15 DIAGNOSIS — M7672 Peroneal tendinitis, left leg: Secondary | ICD-10-CM | POA: Diagnosis not present

## 2016-07-15 MED ORDER — ZOSTER VACCINE LIVE 19400 UNT/0.65ML ~~LOC~~ SUSR: 0.6500 mL | Freq: Once | SUBCUTANEOUS | 0 refills | Status: AC

## 2016-07-15 NOTE — Patient Instructions (Addendum)
You were provided with a pneumonia vaccination today. Your second vaccination will be due in 1 year.   Take the prescription for Zostavax (shingles) to the pharmacy no sooner than 30 days.   We will notify you of your Pap results once received.  Please call me when you're ready for your mammogram.  Continue your efforts towards a healthy lifestyle.  Follow up in 1 year for repeat physical or sooner if needed.  It was a pleasure to see you today!

## 2016-07-15 NOTE — Progress Notes (Signed)
Patient ID: [name], female   DOB: 1951-10-11, 65 y.o.   MRN: [id number]  HPI: Ms. Rizzotti is a 65 year old female who presents today for her Welcome to Medicare Visit.  Past Medical History:  Diagnosis Date  . Anemia    distant past  . Anorexia    college  . Cervicalgia    Had MRI, seeing neurosurgeon 09/22/15  . Depression   . Hammertoe    LEFT 2ND  . Stiff neck    limited turn to left    Current Outpatient Prescriptions  Medication Sig Dispense Refill  . aspirin 81 MG tablet Take 81 mg by mouth daily.    . Cholecalciferol (VITAMIN D-3 PO) Take by mouth.    . MULTIPLE VITAMINS PO Take by mouth.    . meloxicam (MOBIC) 15 MG tablet Take 15 mg by mouth daily.     No current facility-administered medications for this visit.     No Known Allergies  Family History  Problem Relation Age of Onset  . Stroke Mother   . Heart disease Mother     arrhythmia  . Stroke Father   . Arthritis Maternal Grandmother   . Heart disease Maternal Grandfather     Social History   Social History  . Marital status: Married    Spouse name: N/A  . Number of children: N/A  . Years of education: N/A   Occupational History  . Not on file.   Social History Main Topics  . Smoking status: Never Smoker  . Smokeless tobacco: Never Used  . Alcohol use 4.2 oz/week    7 Glasses of wine per week  . Drug use: No  . Sexual activity: Not on file   Other Topics Concern  . Not on file   Social History Narrative   Lives in [place] with husband.    Has 3 children and 1 step son, 31, 38, 61, 15. 1 son in [place], step son in [place]. 2 sons in [place].    No pets.   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     Hospitiliaztions: None  Health Maintenance:    Flu: Did not completed last season. Declines.  Tetanus: Completed in 2016  Pneumovax: Never  Completed  Prevnar: Never Completed, due.  Zostavax: Never completed. Due.  Bone Density: Completed in 2016, osteopenia.  Colonoscopy: Completed in 2009, normal.  Eye Doctor: Has not completed in 2 years. No changes in vision. Will  schedule.  Dental Exam: Completes semi-annually  Mammogram: Completed in 2016, normal.  Pap: Completed in 2014, normal. Due.    Providers: Alma Friendly, PCP, Samara Deist, Podiatrist; Dr. Gloriann Loan, Optometry   I have personally reviewed and have noted: 1. The patient's medical and social history 2. Their use of alcohol, tobacco or illicit drugs 3. Their current medications and supplements 4. The patient's functional ability including ADL's, fall risks, home safety risks  and hearing or visual impairment. 5. Diet and physical activities 6. Evidence for depression or mood disorder  Subjective:   Review of Systems:   Constitutional: Denies fever, malaise, fatigue, headache or abrupt weight changes.  HEENT: Denies eye pain, eye redness, ear pain, ringing in the ears, wax buildup, runny nose, nasal congestion, bloody nose, or sore throat. Respiratory: Denies difficulty breathing, shortness of breath, cough or sputum production.   Cardiovascular: Denies  chest pain, chest tightness, palpitations or swelling in the hands or feet.  Gastrointestinal: Denies abdominal pain, bloating, constipation, diarrhea or blood in the stool.  GU: Denies urgency, frequency, pain with urination, burning sensation, blood in urine, odor or discharge. Musculoskeletal: Denies decrease in range of motion, difficulty with gait, muscle pain. Ongoing left joint pain to her left foot, history of hammer toe/bunion repair in October 2016. History of shoulder surgery, no complaints.  Skin: Denies redness, rashes, lesions or ulcercations. Completed recent dermatology evaluation.  Neurological: Denies dizziness, difficulty with memory, difficulty with speech or problems with balance and  coordination.   No other specific complaints in a complete review of systems (except as listed in HPI above).  Objective:  PE:   There were no vitals taken for this visit. Wt Readings from Last 3 Encounters:  06/17/16 112 lb 6.4 oz (51 kg)  01/13/16 110 lb 12.8 oz (50.3 kg)  09/03/15 111 lb (50.3 kg)    General: Appears their stated age, well developed, well nourished in NAD. Skin: Warm, dry and intact. No rashes, lesions or ulcerations noted. HEENT: Head: normal shape and size; Eyes: sclera white, no icterus, conjunctiva pink, PERRLA and EOMs intact; Ears: Tm's gray and intact, normal light reflex; Nose: mucosa pink and moist, septum midline; Throat/Mouth: Teeth present, mucosa pink and moist, no exudate, lesions or ulcerations noted.  Neck: Normal range of motion. Neck supple, trachea midline. No massses, lumps or thyromegaly present.  Cardiovascular: Normal rate and rhythm. S1,S2 noted.  No murmur, rubs or gallops noted. No JVD or BLE edema. No carotid bruits noted. Pulmonary/Chest: Normal effort and positive vesicular breath sounds. No respiratory distress. No wheezes, rales or ronchi noted.  Abdomen: Soft and nontender. Normal bowel sounds, no bruits noted. No distention or masses noted. Liver, spleen and kidneys non palpable. Musculoskeletal: Normal range of motion. No signs of joint swelling. No difficulty with gait.  Neurological: Alert and oriented. Cranial nerves II-XII intact. Coordination normal. +DTRs bilaterally. Psychiatric: Mood and affect normal. Behavior is normal. Judgment and thought content normal.   EKG:  BMET    Component Value Date/Time   NA 140 07/08/2016 0851   NA 140 11/13/2014 1009   K 4.7 07/08/2016 0851   K 3.3 (L) 11/13/2014 1009   CL 106 07/08/2016 0851   CL 108 (H) 11/13/2014 1009   CO2 29 07/08/2016 0851   CO2 23 11/13/2014 1009   GLUCOSE 85 07/08/2016 0851   GLUCOSE 90 11/13/2014 1009   BUN 18 07/08/2016 0851   BUN 11 11/13/2014 1009    CREATININE 0.80 07/08/2016 0851   CREATININE 0.70 11/13/2014 1009   CALCIUM 9.4 07/08/2016 0851   CALCIUM 8.7 11/13/2014 1009   GFRNONAA >60 11/13/2014 1009   GFRAA >60 11/13/2014 1009    Lipid Panel     Component Value Date/Time   CHOL 202 (A) 07/06/2014 0543   TRIG 57 07/06/2014 0543   HDL 93 (A) 07/06/2014 0543   LDLCALC 98 07/06/2014 0543    CBC    Component Value Date/Time   WBC 5.8 11/13/2014 1009   RBC 4.25 11/13/2014 1009   HGB 13.3 11/13/2014 1009   HCT 39.9 11/13/2014 1009   PLT 299 11/13/2014 1009   MCV 94 11/13/2014 1009   MCH 31.3 11/13/2014 1009   MCHC 33.3 11/13/2014 1009   RDW 12.4 11/13/2014 1009   LYMPHSABS 1.1 11/13/2014 1009   MONOABS 0.4 11/13/2014 1009   EOSABS 0.1 11/13/2014 1009   BASOSABS 0.0 11/13/2014  1009    Hgb A1C Lab Results  Component Value Date   HGBA1C 5.6 07/08/2016      Assessment and Plan:   Medicare Annual Wellness Visit:  Diet: Endorses a Heart healthy diet. Breakfast: Cereal, fruit Lunch: Fruit, peanut butter Dinner: Salad, potatoes, veggie wraps Snacks: Almonds, Yogurt Desserts: Occasionally Beverages: Water, coffee Physical activity: Active, exercises 3-5 days weekly. Depression/mood screen: Negative Hearing: Intact to whispered voice  Visual acuity: Grossly normal, performs annual eye exam  ADLs: Capable Fall risk: None Home safety: Good Cognitive evaluation: Intact to orientation, naming, recall and repetition EOL planning: Has not completed. Full Code.  Preventative Medicine: Prevnar due and provided today. Rx for Zostavax printed with instructions to fill no sooner than 30 days. Declines Flu. Mammogram due in December, will order. Pap due and completed today. Leads a healthy lifestyle through diet and exercise. Exam unremarkable, labs unremarkable.   Next appointment: 1 year for Initial Wellness Visit.

## 2016-07-15 NOTE — Assessment & Plan Note (Signed)
Taking vitamin D supplement. Due in 2 years for repeat bone density testing.

## 2016-07-15 NOTE — Assessment & Plan Note (Signed)
Due in December.

## 2016-07-15 NOTE — Assessment & Plan Note (Signed)
Manages well with PRN Meloxicam.

## 2016-07-15 NOTE — Addendum Note (Signed)
Addended by: Jacqualin Combes on: 07/15/2016 08:40 AM   Modules accepted: Orders

## 2016-07-15 NOTE — Progress Notes (Signed)
Pre visit review using our clinic review tool, if applicable. No additional management support is needed unless otherwise documented below in the visit note. 

## 2016-07-15 NOTE — Assessment & Plan Note (Signed)
Prevnar due and provided today. Rx for Zostavax printed with instructions to fill no sooner than 30 days. Declines Flu. Mammogram due in December, will order. Pap due and completed today. Leads a healthy lifestyle through diet and exercise. Exam unremarkable, labs unremarkable.   I have personally reviewed and have noted: 1. The patient's medical and social history 2. Their use of alcohol, tobacco or illicit drugs 3. Their current medications and supplements 4. The patient's functional ability including ADL's, fall  risks, home safety risks and hearing or visual  impairment. 5. Diet and physical activities 6. Evidence for depression or mood disorder  Follow up in 1 year for repeat physical.

## 2016-07-20 LAB — CYTOLOGY - PAP

## 2016-09-23 ENCOUNTER — Encounter: Payer: Self-pay | Admitting: Primary Care

## 2016-10-26 ENCOUNTER — Other Ambulatory Visit: Payer: Self-pay | Admitting: Primary Care

## 2016-10-26 ENCOUNTER — Encounter: Payer: Self-pay | Admitting: Primary Care

## 2016-10-26 DIAGNOSIS — Z1239 Encounter for other screening for malignant neoplasm of breast: Secondary | ICD-10-CM

## 2016-11-10 ENCOUNTER — Ambulatory Visit
Admission: RE | Admit: 2016-11-10 | Discharge: 2016-11-10 | Disposition: A | Discharge status: Home / Self Care | Payer: Medicare Other | Source: Ambulatory Visit | Attending: Primary Care | Admitting: Primary Care

## 2016-11-10 DIAGNOSIS — Z1231 Encounter for screening mammogram for malignant neoplasm of breast: Secondary | ICD-10-CM | POA: Diagnosis not present

## 2016-11-10 DIAGNOSIS — Z1239 Encounter for other screening for malignant neoplasm of breast: Secondary | ICD-10-CM

## 2016-12-09 DIAGNOSIS — G5762 Lesion of plantar nerve, left lower limb: Secondary | ICD-10-CM | POA: Diagnosis not present

## 2017-01-05 DIAGNOSIS — H524 Presbyopia: Secondary | ICD-10-CM | POA: Diagnosis not present

## 2017-01-05 DIAGNOSIS — Z0100 Encounter for examination of eyes and vision without abnormal findings: Secondary | ICD-10-CM | POA: Diagnosis not present

## 2017-01-06 DIAGNOSIS — H25813 Combined forms of age-related cataract, bilateral: Secondary | ICD-10-CM | POA: Diagnosis not present

## 2017-01-20 DIAGNOSIS — Z0100 Encounter for examination of eyes and vision without abnormal findings: Secondary | ICD-10-CM | POA: Diagnosis not present

## 2017-02-24 DIAGNOSIS — M25579 Pain in unspecified ankle and joints of unspecified foot: Secondary | ICD-10-CM | POA: Diagnosis not present

## 2017-03-24 DIAGNOSIS — M25572 Pain in left ankle and joints of left foot: Secondary | ICD-10-CM | POA: Diagnosis not present

## 2017-04-07 DIAGNOSIS — M25579 Pain in unspecified ankle and joints of unspecified foot: Secondary | ICD-10-CM | POA: Diagnosis not present

## 2017-05-05 DIAGNOSIS — M792 Neuralgia and neuritis, unspecified: Secondary | ICD-10-CM | POA: Diagnosis not present

## 2017-05-05 DIAGNOSIS — M205X2 Other deformities of toe(s) (acquired), left foot: Secondary | ICD-10-CM | POA: Diagnosis not present

## 2017-05-05 DIAGNOSIS — M79672 Pain in left foot: Secondary | ICD-10-CM | POA: Diagnosis not present

## 2017-05-05 DIAGNOSIS — M7672 Peroneal tendinitis, left leg: Secondary | ICD-10-CM | POA: Diagnosis not present

## 2017-05-05 DIAGNOSIS — M65872 Other synovitis and tenosynovitis, left ankle and foot: Secondary | ICD-10-CM | POA: Diagnosis not present

## 2017-05-05 DIAGNOSIS — M25572 Pain in left ankle and joints of left foot: Secondary | ICD-10-CM | POA: Diagnosis not present

## 2017-05-05 DIAGNOSIS — M7742 Metatarsalgia, left foot: Secondary | ICD-10-CM | POA: Diagnosis not present

## 2017-05-05 DIAGNOSIS — M25472 Effusion, left ankle: Secondary | ICD-10-CM | POA: Diagnosis not present

## 2017-06-03 DIAGNOSIS — L821 Other seborrheic keratosis: Secondary | ICD-10-CM | POA: Diagnosis not present

## 2017-06-03 DIAGNOSIS — Q828 Other specified congenital malformations of skin: Secondary | ICD-10-CM | POA: Diagnosis not present

## 2017-06-03 DIAGNOSIS — X32XXXA Exposure to sunlight, initial encounter: Secondary | ICD-10-CM | POA: Diagnosis not present

## 2017-06-03 DIAGNOSIS — L570 Actinic keratosis: Secondary | ICD-10-CM | POA: Diagnosis not present

## 2017-07-11 ENCOUNTER — Other Ambulatory Visit: Payer: Self-pay | Admitting: Primary Care

## 2017-07-11 ENCOUNTER — Telehealth: Payer: Self-pay | Admitting: Family Medicine

## 2017-07-11 DIAGNOSIS — Z0000 Encounter for general adult medical examination without abnormal findings: Secondary | ICD-10-CM

## 2017-07-11 NOTE — Telephone Encounter (Signed)
Forward lab request to PCP

## 2017-07-14 ENCOUNTER — Other Ambulatory Visit (INDEPENDENT_AMBULATORY_CARE_PROVIDER_SITE_OTHER): Payer: Medicare HMO

## 2017-07-14 DIAGNOSIS — Z0000 Encounter for general adult medical examination without abnormal findings: Secondary | ICD-10-CM | POA: Diagnosis not present

## 2017-07-14 LAB — COMPREHENSIVE METABOLIC PANEL
ALT: 18 U/L (ref 0–35)
AST: 21 U/L (ref 0–37)
Albumin: 4.2 g/dL (ref 3.5–5.2)
Alkaline Phosphatase: 55 U/L (ref 39–117)
BUN: 19 mg/dL (ref 6–23)
CO2: 28 mEq/L (ref 19–32)
Calcium: 9.8 mg/dL (ref 8.4–10.5)
Chloride: 104 mEq/L (ref 96–112)
Creatinine, Ser: 0.84 mg/dL (ref 0.40–1.20)
GFR: 71.9 mL/min (ref >60.00)
Glucose, Bld: 91 mg/dL (ref 70–99)
Potassium: 4.3 mEq/L (ref 3.5–5.1)
Sodium: 138 mEq/L (ref 135–145)
Total Bilirubin: 0.4 mg/dL (ref 0.2–1.2)
Total Protein: 7.5 g/dL (ref 6.0–8.3)

## 2017-07-14 LAB — LIPID PANEL
Cholesterol: 186 mg/dL (ref 0–200)
HDL: 68.9 mg/dL (ref >39.00)
LDL Cholesterol: 107 mg/dL — ABNORMAL HIGH (ref 0–99)
NonHDL: 117.52
Total CHOL/HDL Ratio: 3
Triglycerides: 54 mg/dL (ref 0.0–149.0)
VLDL: 10.8 mg/dL (ref 0.0–40.0)

## 2017-07-17 IMAGING — MR MR CERVICAL SPINE W/O CM
5 series · 42 of 48 positions shown · non-contrast
Comparison: None.

CLINICAL DATA: Left-sided neck pain with inability to twist to the
left. No extremity symptoms.

EXAM:
MRI CERVICAL SPINE WITHOUT CONTRAST
TECHNIQUE: Multiplanar, multisequence MR imaging of the cervical spine was
performed. No intravenous contrast was administered.

[Series 2: T2 · sagittal · 3.0mm · 0.70mm/px · 7 of 13 slices shown (1 of 2)]
[im 1/13]
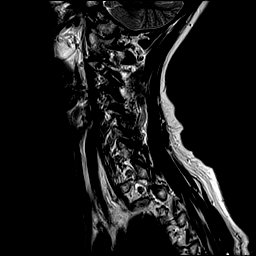
[im 3/13]
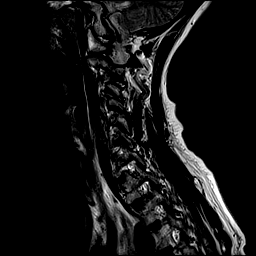
[im 5/13]
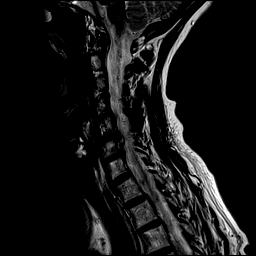
[im 7/13]
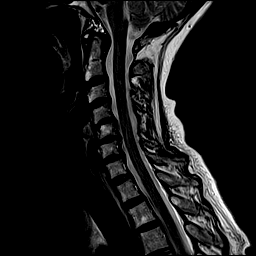
[im 9/13]
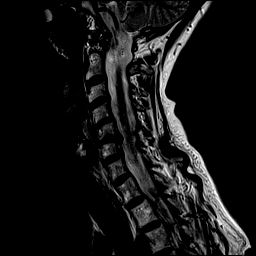
[im 11/13]
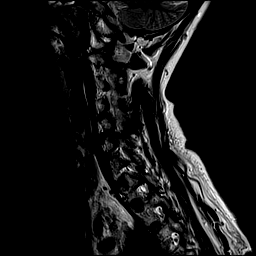
[im 13/13]
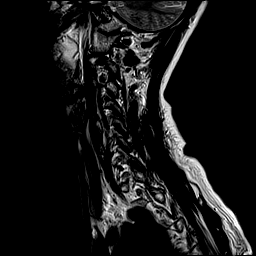

[Series 3: T1 · sagittal · 3.0mm · 0.70mm/px · 7 of 13 slices shown]
[im 1/13]
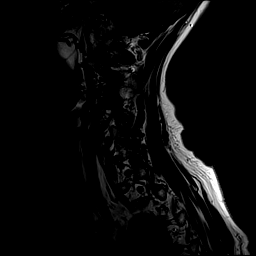
[im 3/13]
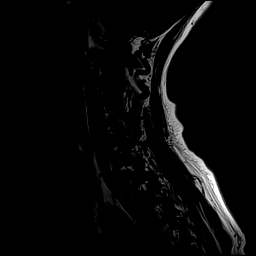
[im 5/13]
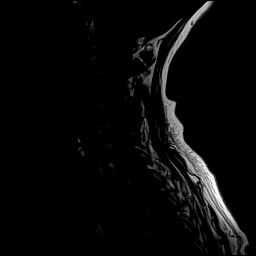
[im 7/13]
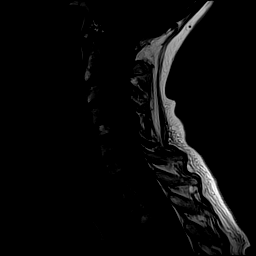
[im 9/13]
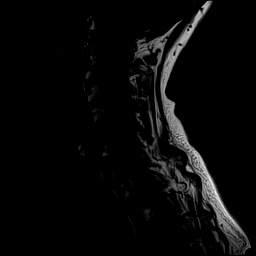
[im 11/13]
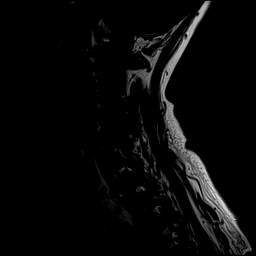
[im 13/13]
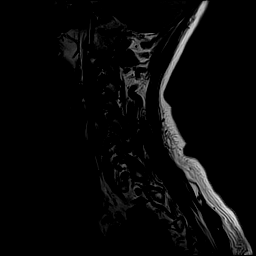

[Series 4: STIR · sagittal · 3.0mm · 0.70mm/px · 7 of 13 slices shown]
[im 1/13]
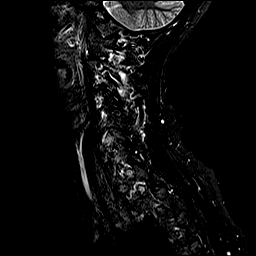
[im 3/13]
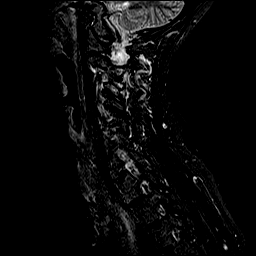
[im 5/13]
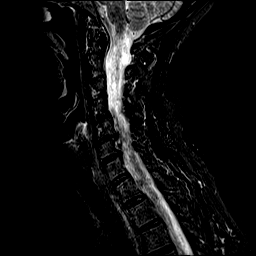
[im 7/13]
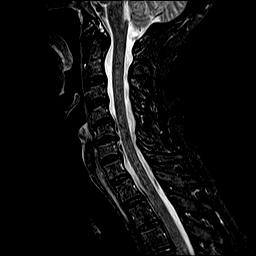
[im 9/13]
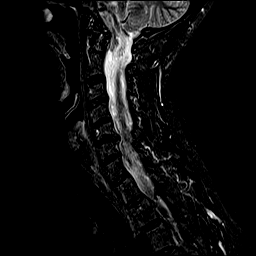
[im 11/13]
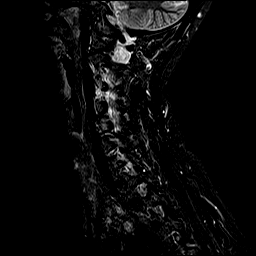
[im 13/13]
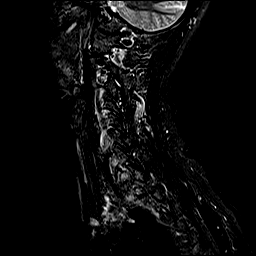

[Series 5: T2 · axial · 3.0mm · 0.70mm/px · z∈[-58,+32]mm · 13 of 24 slices shown (2 of 2)]
[im 1/24]
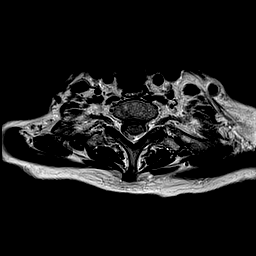
[im 2/24]
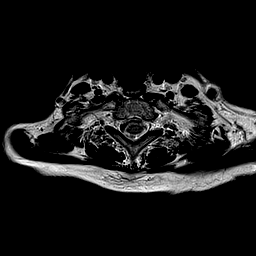
[im 4/24]
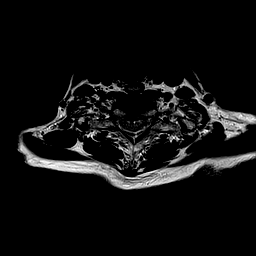
[im 6/24]
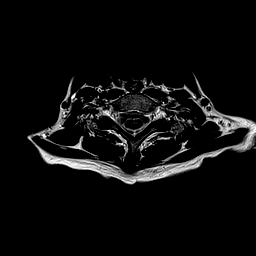
[im 8/24]
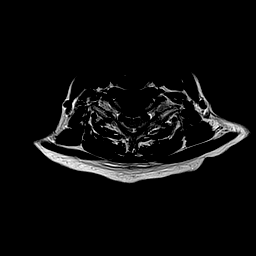
[im 10/24]
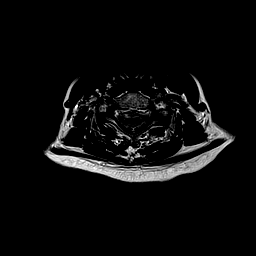
[im 12/24]
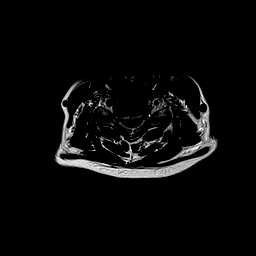
[im 14/24]
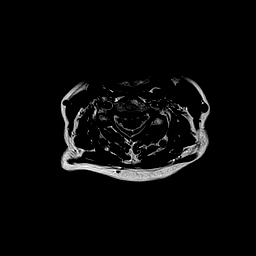
[im 16/24]
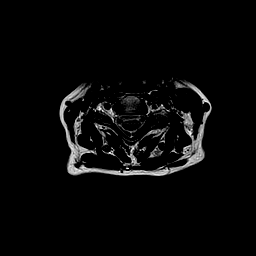
[im 18/24]
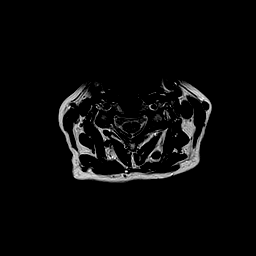
[im 20/24]
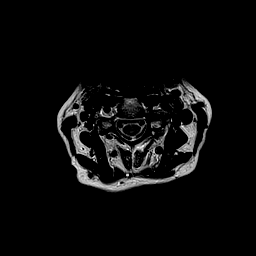
[im 22/24]
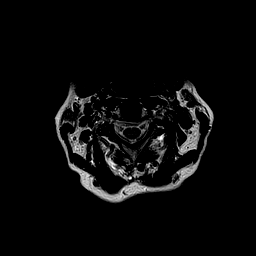
[im 24/24]
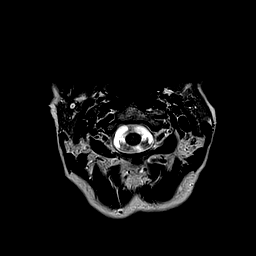

[Series 6: mpgr ax · axial · 3.0mm · 0.35mm/px · z∈[-58,+32]mm · 8 of 25 slices shown]
[im 1/25]
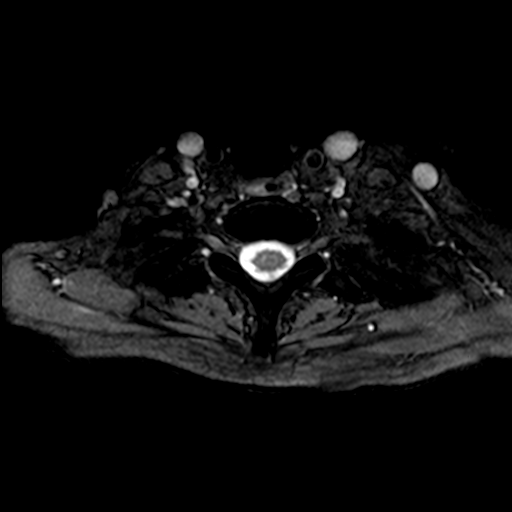
[im 4/25]
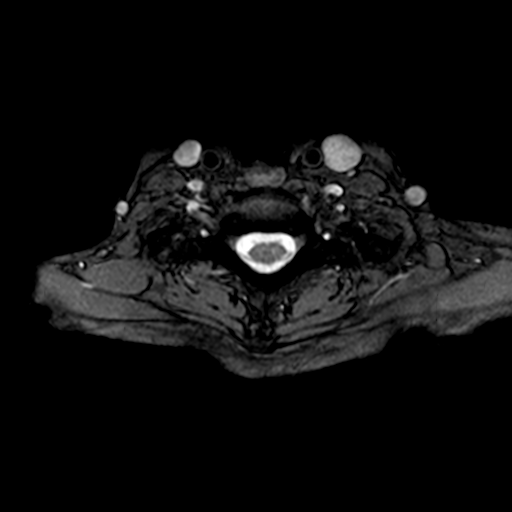
[im 8/25]
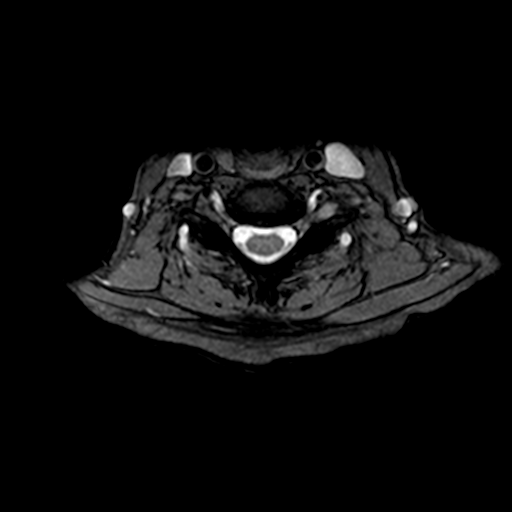
[im 12/25]
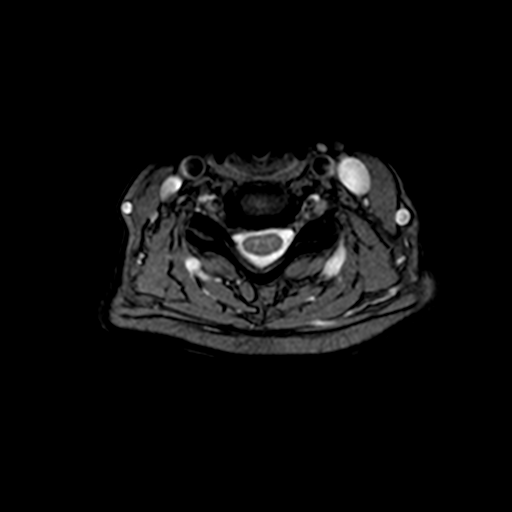
[im 13/25]
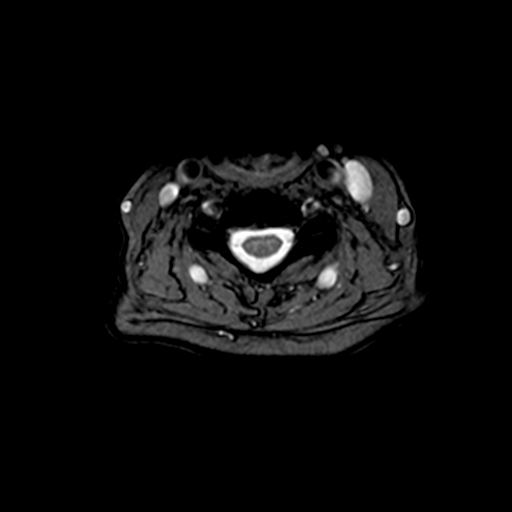
[im 17/25]
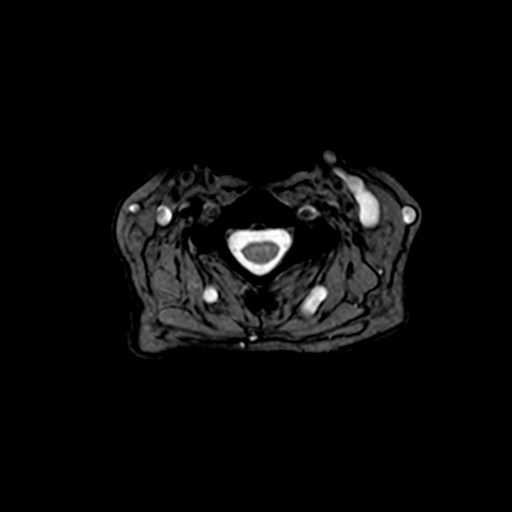
[im 21/25]
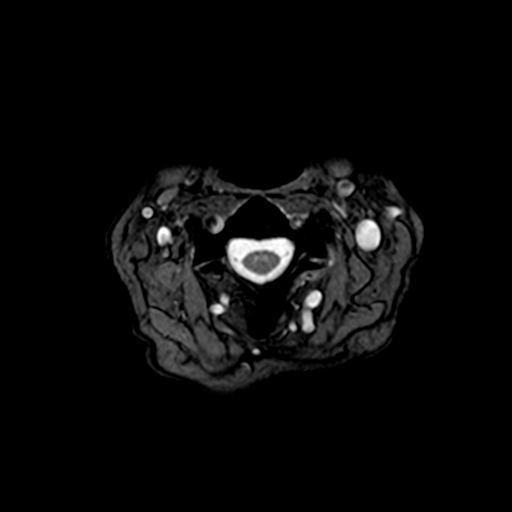
[im 25/25]
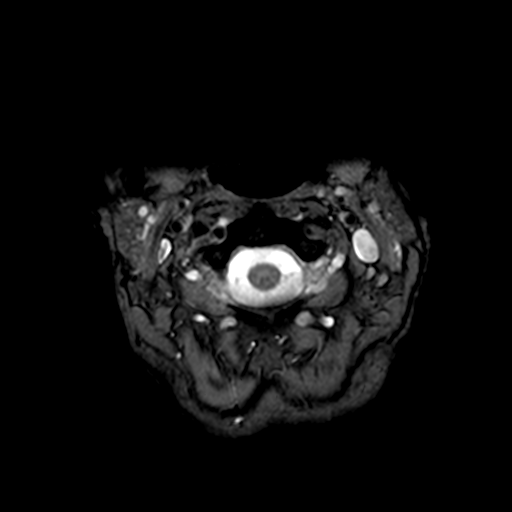

[42 of 48 positions shown; findings below may reference images not displayed]

FINDINGS: No marrow signal abnormality suggestive of fracture, discitis, or
neoplasm.

Normal cord signal and morphology.

No extra-spinal findings to explain symptoms. Normal flow related
signal loss in the visible cervical and carotid arteries.

Degenerative changes:

C2-3: Advanced facet arthropathy with greater overgrowth on the left
where there is also joint effusion and periarticular inflammation.
Slight anterolisthesis. No herniation or impingement.

C3-4: Facet arthropathy with bulky overgrowth on the left. Mild disc
narrowing and annulus bulging. Moderate left foraminal stenosis with
potential C4 impingement.

C4-5: Facet arthropathy with overgrowth bulky on the left. 2 mm of
anterolisthesis is present. Mild left foraminal narrowing.

C5-6: Degenerative disc disease with right greater than left
uncovertebral spurring. Posterior disc osteophyte complex effaces
the ventral CSF without cord compression. Mild facet overgrowth.
Right foraminal stenosis with C6 impingement. The upper foramen
appears open on T2 weighted imaging.

C6-7: Small uncovertebral spurs. Mild annulus bulging. Negative
facets. Patent canal and foramina.

C7-T1:Facet arthropathy with slight anterolisthesis. No herniation
or impingement.
IMPRESSION: 1. Advanced facet arthropathy with notable inflammatory features on
the left at C2-3. Disc degenerative changes are less advanced but
still extensive.
2. Foraminal stenosis with potential impingement on the left at C3-4
and on the right at C5-6.
3. Patent spinal canal.

## 2017-07-21 ENCOUNTER — Encounter: Payer: Self-pay | Admitting: Primary Care

## 2017-07-21 ENCOUNTER — Ambulatory Visit (INDEPENDENT_AMBULATORY_CARE_PROVIDER_SITE_OTHER): Payer: Medicare HMO | Admitting: Primary Care

## 2017-07-21 VITALS — BP 110/66 | HR 66 | Temp 97.7°F | Ht 63.0 in | Wt 115.8 lb

## 2017-07-21 DIAGNOSIS — Z23 Encounter for immunization: Secondary | ICD-10-CM

## 2017-07-21 DIAGNOSIS — Z0000 Encounter for general adult medical examination without abnormal findings: Secondary | ICD-10-CM | POA: Diagnosis not present

## 2017-07-21 DIAGNOSIS — M8580 Other specified disorders of bone density and structure, unspecified site: Secondary | ICD-10-CM

## 2017-07-21 DIAGNOSIS — Z1239 Encounter for other screening for malignant neoplasm of breast: Secondary | ICD-10-CM

## 2017-07-21 MED ORDER — ZOSTER VAC RECOMB ADJUVANTED 50 MCG/0.5ML IM SUSR: 0.5000 mL | Freq: Once | INTRAMUSCULAR | 1 refills | Status: AC

## 2017-07-21 NOTE — Assessment & Plan Note (Signed)
Pneumovax due, provided today. Rx for Shingrix provided today. Bone density and mammogram due in December 2018, pending. Pap due in 2020. Colonoscopy due in 2019. Commended her on her healthy lifestyle. Exam unremarkable. Labs unremarkable. All recommendations provided at end of visit.   I have personally reviewed and have noted: 1. The patient's medical and social history 2. Their use of alcohol, tobacco or illicit drugs 3. Their current medications and supplements 4. The patient's functional ability including ADL's, fall  risks, home safety risks and hearing or visual  impairment. 5. Diet and physical activities 6. Evidence for depression or mood disorder

## 2017-07-21 NOTE — Patient Instructions (Signed)
Continue exercising. You should be getting 150 minutes of moderate intensity exercise weekly.  You were provided with a pneumonia vaccination which completes your series.  Take the Shingles vaccination to your pharmacy in 1 month. This is a two dose series.  Schedule your bone density test and mammogram in December this year.  Follow up in 1 year for your annual exam or sooner if needed.  It was a pleasure to see you today!

## 2017-07-21 NOTE — Progress Notes (Signed)
Patient ID: Robyn House, female   DOB: 27-Mar-1951, 66 y.o.   MRN: [id number]  HPI: [name] is a 66 year old female who presents today for her Initial Medicare Wellness Visit.   Past Medical History:  Diagnosis Date  . Anemia    distant past  . Anorexia    college  . Cervicalgia    Had MRI, seeing neurosurgeon 09/22/15  . Depression   . Hammertoe    LEFT 2ND  . Stiff neck    limited turn to left    Current Outpatient Prescriptions  Medication Sig Dispense Refill  . aspirin 81 MG tablet Take 81 mg by mouth daily.    . Cholecalciferol (VITAMIN D-3 PO) Take by mouth.    . meloxicam (MOBIC) 15 MG tablet Take 15 mg by mouth daily.    . MULTIPLE VITAMINS PO Take by mouth.     No current facility-administered medications for this visit.     No Known Allergies  Family History  Problem Relation Age of Onset  . Stroke Mother   . Heart disease Mother        arrhythmia  . Stroke Father   . Arthritis Maternal Grandmother   . Heart disease Maternal Grandfather   . Breast cancer Other 80    Social History   Social History  . Marital status: Married    Spouse name: N/A  . Number of children: N/A  . Years of education: N/A   Occupational History  . Not on file.   Social History Main Topics  . Smoking status: Never Smoker  . Smokeless tobacco: Never Used  . Alcohol use 4.2 oz/week    7 Glasses of wine per week  . Drug use: No  . Sexual activity: Not on file   Other Topics Concern  . Not on file   Social History Narrative   Lives in [place] with husband.    Has 3 children and 1 step son, 1, 38, 70, 5. 1 son in [place], step son in [place]. 2 sons in [place].    No pets.   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     Hospitiliaztions: None  Health Maintenance:    Flu: Due this season   Tetanus: Completed in 2016  Pneumovax: Due  today  Prevnar: Completed in 2017  Zostavax: Due, never completed Zostavax.   Bone Density: Due.  Colonoscopy: Completed in 2009, normal.  Eye Doctor: Completed in 2018  Dental Exam: Completes semi-annually  Mammogram: Completed in December 2017, due.  Pap: Completed in 2017, normal.  Hep C Screening:    Providers: Robyn House, PCP; Robyn House, Podiatrist; Robyn House, Optometry; Robyn House, Dermatologist   I have personally reviewed and have noted: 1. The patient's medical and social history 2. Their use of alcohol, tobacco or illicit drugs 3. Their current medications and supplements 4. The patient's functional ability including ADL's, fall risks, home safety risks  and hearing or visual impairment. 5. Diet and physical activities 6. Evidence for depression or mood disorder  Subjective:   Review of Systems:   Constitutional: Denies fever, malaise, fatigue, headache or abrupt weight changes.  HEENT: Denies eye pain, eye redness, ear pain, ringing in the ears, wax buildup, runny nose, nasal congestion, bloody nose, or sore throat. Respiratory: Denies difficulty breathing, shortness of breath, cough or sputum production.  Cardiovascular: Denies chest pain, chest tightness, palpitations or swelling in the hands or feet.  Gastrointestinal: Denies abdominal pain, bloating, constipation, diarrhea or blood in the stool.  GU: Denies urgency, frequency, pain with urination, burning sensation, blood in urine, odor or discharge. Musculoskeletal: Following with orthopedics for left foot and ankle pain. Recently completed MRI with torn ligament to left ankle. Skin: Denies redness, rashes, lesions or ulcercations.  Neurological: Denies dizziness, difficulty with memory, difficulty with speech or problems with balance and coordination.  Psychiatric: Denies concerns for anxiety or depression.   No other specific complaints in a complete review of systems (except as listed in HPI  above).  Objective:  PE:   There were no vitals taken for this visit. Wt Readings from Last 3 Encounters:  07/15/16 109 lb 12.8 oz (49.8 kg)  06/17/16 112 lb 6.4 oz (51 kg)  01/13/16 110 lb 12.8 oz (50.3 kg)    General: Appears their stated age, well developed, well nourished in NAD. Skin: Warm, dry and intact. No rashes, lesions or ulcerations noted. HEENT: Head: normal shape and size; Eyes: sclera white, no icterus, conjunctiva pink, PERRLA and EOMs intact; Ears: Tm's gray and intact, normal light reflex; Nose: mucosa pink and moist, septum midline; Throat/Mouth: Teeth present, mucosa pink and moist, no exudate, lesions or ulcerations noted.  Neck: Normal range of motion. Neck supple, trachea midline. No massses, lumps or thyromegaly present.  Cardiovascular: Normal rate and rhythm. S1,S2 noted.  No murmur, rubs or gallops noted. No JVD or BLE edema. No carotid bruits noted. Pulmonary/Chest: Normal effort and positive vesicular breath sounds. No respiratory distress. No wheezes, rales or ronchi noted.  Abdomen: Soft and nontender. Normal bowel sounds, no bruits noted. No distention or masses noted. Liver, spleen and kidneys non palpable. Musculoskeletal: Normal range of motion. No signs of joint swelling. No difficulty with gait.  Neurological: Alert and oriented. Cranial nerves II-XII intact. Coordination normal. +DTRs bilaterally. Psychiatric: Mood and affect normal. Behavior is normal. Judgment and thought content normal.    BMET    Component Value Date/Time   NA 138 07/14/2017 0929   NA 140 11/13/2014 1009   K 4.3 07/14/2017 0929   K 3.3 (L) 11/13/2014 1009   CL 104 07/14/2017 0929   CL 108 (H) 11/13/2014 1009   CO2 28 07/14/2017 0929   CO2 23 11/13/2014 1009   GLUCOSE 91 07/14/2017 0929   GLUCOSE 90 11/13/2014 1009   BUN 19 07/14/2017 0929   BUN 11 11/13/2014 1009   CREATININE 0.84 07/14/2017 0929   CREATININE 0.70 11/13/2014 1009   CALCIUM 9.8 07/14/2017 0929    CALCIUM 8.7 11/13/2014 1009   GFRNONAA >60 11/13/2014 1009   GFRAA >60 11/13/2014 1009    Lipid Panel     Component Value Date/Time   CHOL 186 07/14/2017 0929   TRIG 54.0 07/14/2017 0929   HDL 68.90 07/14/2017 0929   CHOLHDL 3 07/14/2017 0929   VLDL 10.8 07/14/2017 0929   LDLCALC 107 (H) 07/14/2017 0929    CBC    Component Value Date/Time   WBC 5.8 11/13/2014 1009   RBC 4.25 11/13/2014 1009   HGB 13.3 11/13/2014 1009   HCT 39.9 11/13/2014 1009   PLT 299 11/13/2014 1009   MCV 94 11/13/2014 1009   MCH 31.3 11/13/2014 1009   MCHC 33.3 11/13/2014 1009   RDW 12.4 11/13/2014 1009   LYMPHSABS 1.1 11/13/2014 1009   MONOABS 0.4 11/13/2014 1009   EOSABS 0.1 11/13/2014 1009   BASOSABS  0.0 11/13/2014 1009    Hgb A1C Lab Results  Component Value Date   HGBA1C 5.6 07/08/2016      Assessment and Plan:   Medicare Annual Wellness Visit:  Diet: She endorses a healthy diet. Breakfast: Cereal, fruit Lunch: Apple, peanut butter Dinner: Meat, salads, vegetables Snacks: Nuts, graham crackers, peanut butter Desserts: Occasionally  Beverages: Water, coffee, wine at night Physical activity: Exercises 2-3 times weekly at the gym, yoga twice weekly. Depression/mood screen: Negative Hearing: Intact to whispered voice Visual acuity: Grossly normal, performs annual eye exam  ADLs: Capable Fall risk: None Home safety: Good Cognitive evaluation: Intact to orientation, naming, recall and repetition EOL planning: Adv directives, full code/ I agree  Preventative Medicine: Pneumovax due, provided today. Rx for Shingrix provided today. Bone density and mammogram due in December 2018, pending. Pap due in 2020. Colonoscopy due in 2019. Commended her on her healthy lifestyle. Exam unremarkable. Labs unremarkable. All recommendations provided at end of visit.   Next appointment: 1 year.

## 2017-07-21 NOTE — Assessment & Plan Note (Signed)
Due this December 2018, orders placed.

## 2017-07-21 NOTE — Assessment & Plan Note (Signed)
Due this Winter, order placed. Commended her on weight bearing activities.

## 2017-07-21 NOTE — Addendum Note (Signed)
Addended by: Jacqualin Combes on: 07/21/2017 01:28 PM   Modules accepted: Orders

## 2017-10-13 DIAGNOSIS — M79672 Pain in left foot: Secondary | ICD-10-CM | POA: Diagnosis not present

## 2017-10-13 DIAGNOSIS — M659 Synovitis and tenosynovitis, unspecified: Secondary | ICD-10-CM | POA: Diagnosis not present

## 2017-11-23 DIAGNOSIS — R69 Illness, unspecified: Secondary | ICD-10-CM | POA: Diagnosis not present

## 2017-12-02 ENCOUNTER — Ambulatory Visit: Payer: Self-pay | Admitting: *Deleted

## 2017-12-02 NOTE — Telephone Encounter (Signed)
Patient is out of town and called with common uti symptoms: burning and hesitation with voiding along with pressure in the area, while sitting. Home care advice given. Answer Assessment - Initial Assessment Questions 1. SYMPTOM: "What's the main symptom you're concerned about?" (e.g., frequency, incontinence)     Burning with voiding 2. ONSET: "When did the  ________  start?"    Wednesday 3. PAIN: "Is there any pain?" If so, ask: "How bad is it?" (Scale: 1-10; mild, moderate, severe)    moderate 4. CAUSE: "What do you think is causing the symptoms?"    Maybe uti 5. OTHER SYMPTOMS: "Do you have any other symptoms?" (e.g., fever, flank pain, blood in urine, pain with urination)     Pressure with voiding 6. PREGNANCY: "Is there any chance you are pregnant?" "When was your last menstrual period?"     no  Protocols used: URINARY [facility] ([facility])

## 2017-12-05 ENCOUNTER — Encounter: Payer: Self-pay | Admitting: Family Medicine

## 2017-12-05 ENCOUNTER — Ambulatory Visit (INDEPENDENT_AMBULATORY_CARE_PROVIDER_SITE_OTHER): Payer: Medicare HMO | Admitting: Family Medicine

## 2017-12-05 VITALS — BP 124/80 | HR 66 | Temp 97.5°F | Wt 117.8 lb

## 2017-12-05 DIAGNOSIS — R350 Frequency of micturition: Secondary | ICD-10-CM | POA: Diagnosis not present

## 2017-12-05 DIAGNOSIS — N3090 Cystitis, unspecified without hematuria: Secondary | ICD-10-CM | POA: Diagnosis not present

## 2017-12-05 DIAGNOSIS — M7060 Trochanteric bursitis, unspecified hip: Secondary | ICD-10-CM | POA: Insufficient documentation

## 2017-12-05 LAB — POC URINALSYSI DIPSTICK (AUTOMATED)
Bilirubin, UA: NEGATIVE
Blood, UA: POSITIVE
Glucose, UA: NEGATIVE
Ketones, UA: NEGATIVE
Nitrite, UA: NEGATIVE
Protein, UA: NEGATIVE
Spec Grav, UA: 1.015 (ref 1.010–1.025)
Urobilinogen, UA: 0.2 E.U./dL
pH, UA: 6 (ref 5.0–8.0)

## 2017-12-05 MED ORDER — NITROFURANTOIN MONOHYD MACRO 100 MG PO CAPS: 100.0000 mg | ORAL_CAPSULE | Freq: Two times a day (BID) | ORAL | 0 refills | Status: DC

## 2017-12-05 NOTE — Patient Instructions (Addendum)

## 2017-12-05 NOTE — Progress Notes (Signed)
Subjective:    Patient ID: Robyn House, female    DOB: [birth year], 67 y.o.   MRN: [id number]  HPI This is a 67 yo female who presents today with 4 days urinary urgency, dysuria. Took a few AZO without relief. Some relief with ibuprofen. No back pain, no nausea. Lower abdominal pain/pressure. No vaginal itching, burning or discharge. No history of UTI, not sexually active (husband with prostate cancer), no recent travel. Does find it difficult to drink enough liquids throughout the day while working.   Past Medical History:  Diagnosis Date  . Anemia    distant past  . Anorexia    college  . Cervicalgia    Had MRI, seeing neurosurgeon 09/22/15  . Depression   . Hammertoe    LEFT 2ND  . Stiff neck    limited turn to left   Past Surgical History:  Procedure Laterality Date  . CORRECTION OVERLAPPING TOES Left 09/03/2015   Procedure: CORRECTION OVERLAPPING TOE LEFT SECOND TOE;  Surgeon: Samara Deist, DPM;  Location: [place];  Service: Podiatry;  Laterality: Left;  . HALLUX VALGUS LAPIDUS Left 09/03/2015   Procedure: HALLUX VALGUS LAPIDUS LEFT FOOT;  Surgeon: Samara Deist, DPM;  Location: [place];  Service: Podiatry;  Laterality: Left;  WITH POPLITEAL 2ND CASE PER SCHEDULING SHEET  . HAMMER TOE SURGERY Left 09/03/2015   Procedure: HAMMER TOE CORRECTION LEFT SECOND TOE;  Surgeon: Samara Deist, DPM;  Location: [place];  Service: Podiatry;  Laterality: Left;  . INNER EAR SURGERY     Right ear drum collapsed, Dr. Tami Ribas  . SHOULDER SURGERY Right    Rotator cuff repair  . TONSILECTOMY/ADENOIDECTOMY WITH MYRINGOTOMY     Family History  Problem Relation Age of Onset  . Stroke Mother   . Heart disease Mother        arrhythmia  . Stroke Father   . Arthritis Maternal Grandmother   . Heart disease Maternal Grandfather   . Breast cancer Other 58   Social History   Tobacco Use  . Smoking status: Never Smoker  . Smokeless tobacco: Never Used    Substance Use Topics  . Alcohol use: Yes    Alcohol/week: 4.2 oz    Types: 7 Glasses of wine per week  . Drug use: No      Review of Systems Per HPI    Objective:   Physical Exam Physical Exam  Constitutional: She is oriented to person, place, and time. She appears well-developed and well-nourished. No distress.  HENT:  Head: Normocephalic and atraumatic.  Cardiovascular: Normal rate, regular rhythm and normal heart sounds.   Pulmonary/Chest: Effort normal and breath sounds normal.  Abdominal: Soft. She exhibits no distension. There is no tenderness. There is no rebound, no guarding and no CVA tenderness.  Neurological: She is alert and oriented to person, place, and time.  Skin: Skin is warm and dry. She is not diaphoretic.  Psychiatric: She has a normal mood and affect. Her behavior is normal. Judgment and thought content normal.  Vitals reviewed.   BP 124/80   Pulse 66   Temp (!) 97.5 F (36.4 C) (Oral)   Wt 117 lb 12 oz (53.4 kg)   SpO2 99%   BMI 20.86 kg/m  Results for orders placed or performed in visit on 12/05/17  POCT Urinalysis Dipstick (Automated)  Result Value Ref Range   Color, UA yellow    Clarity, UA cloudy    Glucose, UA neg  Bilirubin, UA neg    Ketones, UA neg    Spec Grav, UA 1.015 1.010 - 1.025   Blood, UA pos    pH, UA 6.0 5.0 - 8.0   Protein, UA neg    Urobilinogen, UA 0.2 0.2 or 1.0 E.U./dL   Nitrite, UA neg    Leukocytes, UA Large (3+) (A) Negative       Assessment & Plan:  1. Urinary frequency - POCT Urinalysis Dipstick (Automated)  2. Cystitis - Provided written and verbal information regarding diagnosis and treatment. - RTC precautions reviewed - nitrofurantoin, macrocrystal-monohydrate, (MACROBID) 100 MG capsule; Take 1 capsule (100 mg total) by mouth 2 (two) times daily.  Dispense: 14 capsule; Refill: 0 - urine culture  Clarene Reamer, FNP-BC  [facility]  12/05/2017  1:41 PM

## 2017-12-07 LAB — URINE CULTURE
MICRO NUMBER:: 90085556
SPECIMEN QUALITY:: ADEQUATE

## 2017-12-15 ENCOUNTER — Ambulatory Visit
Admission: RE | Admit: 2017-12-15 | Discharge: 2017-12-15 | Disposition: A | Discharge status: Home / Self Care | Payer: Medicare HMO | Source: Ambulatory Visit | Attending: Primary Care | Admitting: Primary Care

## 2017-12-15 DIAGNOSIS — M8588 Other specified disorders of bone density and structure, other site: Secondary | ICD-10-CM | POA: Insufficient documentation

## 2017-12-15 DIAGNOSIS — Z1231 Encounter for screening mammogram for malignant neoplasm of breast: Secondary | ICD-10-CM | POA: Insufficient documentation

## 2017-12-15 DIAGNOSIS — M8580 Other specified disorders of bone density and structure, unspecified site: Secondary | ICD-10-CM

## 2017-12-15 DIAGNOSIS — Z780 Asymptomatic menopausal state: Secondary | ICD-10-CM | POA: Diagnosis not present

## 2017-12-15 DIAGNOSIS — Z1239 Encounter for other screening for malignant neoplasm of breast: Secondary | ICD-10-CM

## 2017-12-16 DIAGNOSIS — M7542 Impingement syndrome of left shoulder: Secondary | ICD-10-CM | POA: Diagnosis not present

## 2017-12-22 ENCOUNTER — Telehealth: Payer: Self-pay | Admitting: Primary Care

## 2017-12-22 NOTE — Telephone Encounter (Signed)
Copied from [facility]. Topic: General - Other >> Dec 22, 2017  8:58 AM Lolita Rieger, RMA wrote: Reason for CRM: pt called and stated that she is still having UTI symptoms and would like to know if she needs to come in or if something else can be called in for her pt pharmacy is [facility] on [address]  Pt call back number is [phone]

## 2017-12-22 NOTE — Telephone Encounter (Signed)
Spoke to pt and sched for 2/8

## 2017-12-23 ENCOUNTER — Ambulatory Visit (INDEPENDENT_AMBULATORY_CARE_PROVIDER_SITE_OTHER): Payer: Medicare HMO | Admitting: Family Medicine

## 2017-12-23 ENCOUNTER — Encounter: Payer: Self-pay | Admitting: Family Medicine

## 2017-12-23 VITALS — BP 132/70 | HR 65 | Temp 97.5°F | Wt 118.5 lb

## 2017-12-23 DIAGNOSIS — R300 Dysuria: Secondary | ICD-10-CM

## 2017-12-23 DIAGNOSIS — N3090 Cystitis, unspecified without hematuria: Secondary | ICD-10-CM

## 2017-12-23 LAB — POC URINALSYSI DIPSTICK (AUTOMATED)
Bilirubin, UA: NEGATIVE
Blood, UA: NEGATIVE
Glucose, UA: NEGATIVE
Ketones, UA: NEGATIVE
Leukocytes, UA: NEGATIVE
Nitrite, UA: NEGATIVE
Protein, UA: NEGATIVE
Spec Grav, UA: 1.01 (ref 1.010–1.025)
Urobilinogen, UA: 0.2 E.U./dL
pH, UA: 6 (ref 5.0–8.0)

## 2017-12-23 MED ORDER — NITROFURANTOIN MONOHYD MACRO 100 MG PO CAPS: 100.0000 mg | ORAL_CAPSULE | Freq: Two times a day (BID) | ORAL | 0 refills | Status: DC

## 2017-12-23 NOTE — Progress Notes (Signed)
Dysuria: yes, burning, frequency, pressure.   duration of symptoms: a few days abdominal pain: no fevers:no back pain:no Vomiting:no Discharge: no other concerns: she had recent ucx pos, treated and improved until a few days ago.    Meds, vitals, and allergies reviewed.   Per HPI unless specifically indicated in ROS section   GEN: nad, alert and oriented HEENT: mucous membranes moist NECK: supple CV: rrr.  PULM: ctab, no inc wob ABD: soft, +bs, suprapubic area mildly tender EXT: no edema BACK: no CVA pain

## 2017-12-23 NOTE — Patient Instructions (Signed)
Drink plenty of fluids.  We'll contact you with your lab report. Start the antibiotics if more symptoms.  Take care.  Glad to see you.

## 2017-12-24 LAB — URINE CULTURE
MICRO NUMBER:: 90173035
Result:: NO GROWTH
SPECIMEN QUALITY:: ADEQUATE

## 2017-12-25 DIAGNOSIS — R300 Dysuria: Secondary | ICD-10-CM | POA: Insufficient documentation

## 2017-12-25 NOTE — Assessment & Plan Note (Addendum)
Possible cystitis.  Start Macrobid if more symptoms.  Check urine culture in the meantime.  Nontoxic.  Okay for outpatient follow-up.  All questions answered.  She agrees.

## 2018-01-04 ENCOUNTER — Ambulatory Visit (INDEPENDENT_AMBULATORY_CARE_PROVIDER_SITE_OTHER): Payer: Medicare HMO | Admitting: Primary Care

## 2018-01-04 ENCOUNTER — Encounter: Payer: Self-pay | Admitting: Primary Care

## 2018-01-04 VITALS — BP 124/74 | HR 87 | Temp 99.6°F | Wt 116.8 lb

## 2018-01-04 DIAGNOSIS — R6889 Other general symptoms and signs: Secondary | ICD-10-CM

## 2018-01-04 LAB — POC INFLUENZA A&B (BINAX/QUICKVUE)
Influenza A, POC: NEGATIVE
Influenza B, POC: NEGATIVE

## 2018-01-04 NOTE — Progress Notes (Signed)
Subjective:    Patient ID: Robyn House, female    DOB: Sep 04, 1951, 67 y.o.   MRN: [id number]  HPI  Robyn House is a 67 year old female who presents today with a chief complaint of flu-like symptoms. She reports nasal congestion, cough, chills, body aches, fatigue, sore throat.   Her symptoms began one week ago. She took Dayquil, Nyquil, Tylenol and began to feel better. Three days ago her symptoms returned. Her cough is productive with gray/green sputum. She's run some low grade fevers of 99-100. She's been around her granddaughter two weeks ago who had the same symptoms.   Review of Systems  Constitutional: Positive for chills and fever.  HENT: Positive for congestion, sinus pressure and sore throat. Negative for ear pain.   Respiratory: Positive for cough. Negative for shortness of breath.   Cardiovascular: Negative for chest pain.       Past Medical History:  Diagnosis Date  . Anemia    distant past  . Anorexia    college  . Cervicalgia    Had MRI, seeing neurosurgeon 09/22/15  . Depression   . Hammertoe    LEFT 2ND  . Stiff neck    limited turn to left     Social History   Socioeconomic History  . Marital status: Married    Spouse name: Not on file  . Number of children: Not on file  . Years of education: Not on file  . Highest education level: Not on file  Social Needs  . Financial resource strain: Not on file  . Food insecurity - worry: Not on file  . Food insecurity - inability: Not on file  . Transportation needs - medical: Not on file  . Transportation needs - non-medical: Not on file  Occupational History  . Not on file  Tobacco Use  . Smoking status: Never Smoker  . Smokeless tobacco: Never Used  Substance and Sexual Activity  . Alcohol use: Yes    Alcohol/week: 4.2 oz    Types: 7 Glasses of wine per week  . Drug use: No  . Sexual activity: Not on file  Other Topics Concern  . Not on file  Social History Narrative   Lives in [place] with  husband.    Has 3 children and 1 step son, 56, 4, 22, 20. 1 son in [place], step son in [place]. 2 sons in [place].    No pets.   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     Past Surgical History:  Procedure Laterality Date  . CORRECTION OVERLAPPING TOES Left 09/03/2015   Procedure: CORRECTION OVERLAPPING TOE LEFT SECOND TOE;  Surgeon: Samara Deist, DPM;  Location: [place];  Service: Podiatry;  Laterality: Left;  . HALLUX VALGUS LAPIDUS Left 09/03/2015   Procedure: HALLUX VALGUS LAPIDUS LEFT FOOT;  Surgeon: Samara Deist, DPM;  Location: [place];  Service: Podiatry;  Laterality: Left;  WITH POPLITEAL 2ND CASE PER SCHEDULING SHEET  . HAMMER TOE SURGERY Left 09/03/2015   Procedure: HAMMER TOE CORRECTION LEFT SECOND TOE;  Surgeon: Samara Deist, DPM;  Location: [place];  Service: Podiatry;  Laterality: Left;  . INNER EAR SURGERY     Right ear drum collapsed, Dr. Tami Ribas  . SHOULDER SURGERY Right    Rotator cuff repair  . TONSILECTOMY/ADENOIDECTOMY WITH MYRINGOTOMY  Family History  Problem Relation Age of Onset  . Stroke Mother   . Heart disease Mother        arrhythmia  . Stroke Father   . Arthritis Maternal Grandmother   . Heart disease Maternal Grandfather   . Breast cancer Other 39    No Known Allergies  Current Outpatient Medications on File Prior to Visit  Medication Sig Dispense Refill  . aspirin 81 MG tablet Take 81 mg by mouth daily.    . Cholecalciferol (VITAMIN D-3 PO) Take by mouth.    . gabapentin (NEURONTIN) 100 MG capsule Take 200 mg by mouth at bedtime.     . MULTIPLE VITAMINS PO Take by mouth.     No current facility-administered medications on file prior to visit.     BP 124/74   Pulse 87   Temp 99.6 F (37.6 C) (Oral)   Wt 116 lb 12 oz (53 kg)   SpO2 97%   BMI 20.68 kg/m     Objective:   Physical Exam  Constitutional: She appears well-nourished.  HENT:  Right Ear: Ear canal normal. Tympanic membrane is retracted. Tympanic membrane is not erythematous.  Left Ear: Ear canal normal. Tympanic membrane is bulging. Tympanic membrane is not erythematous and not retracted.  Nose: Mucosal edema present. Right sinus exhibits maxillary sinus tenderness. Right sinus exhibits no frontal sinus tenderness. Left sinus exhibits maxillary sinus tenderness. Left sinus exhibits no frontal sinus tenderness.  Mouth/Throat: Oropharynx is clear and moist.  Eyes: Conjunctivae are normal.  Neck: Neck supple.  Cardiovascular: Normal rate and regular rhythm.  Pulmonary/Chest: Effort normal and breath sounds normal. She has no wheezes. She has no rales.  Lymphadenopathy:    She has no cervical adenopathy.  Skin: Skin is warm and dry.          Assessment & Plan:  URI:  Cough, congestion, fatigue, low grade fevers x 3 days.  Exam today with clear lungs, does feel clammy, not sickly. Suspect viral involvement at this point, influenza test negative. Treat with conservative measures including Delsym, Mucinex, Tylenol. Return precautions provided.  Pleas Koch, NP

## 2018-01-04 NOTE — Patient Instructions (Signed)
You do not have influenza.  Cough/Congestion: Try taking Mucinex DM or Delsym DM. This will help loosen up the mucous in your chest. Ensure you take this medication with a full glass of water.  Nasal Congestion/Ear Pressure: Try using Flonase (fluticasone) nasal spray. Instill 1 spray in each nostril twice daily.    Continue Tylenol for fevers and body aches, do not exceed 3000 mg in 24 hours.   Please notify me if you develop persistent fevers of 101, continue coughing up green mucous, or feel worse Monday next week.   Increase consumption of water intake and rest.  It was a pleasure to see you today!   Cough, Adult Coughing is a reflex that clears your throat and your airways. Coughing helps to heal and protect your lungs. It is normal to cough occasionally, but a cough that happens with other symptoms or lasts a long time may be a sign of a condition that needs treatment. A cough may last only 2-3 weeks (acute), or it may last longer than 8 weeks (chronic). What are the causes? Coughing is commonly caused by:  Breathing in substances that irritate your lungs.  A viral or bacterial respiratory infection.  Allergies.  Asthma.  Postnasal drip.  Smoking.  Acid backing up from the stomach into the esophagus (gastroesophageal reflux).  Certain medicines.  Chronic lung problems, including COPD (or rarely, lung cancer).  Other medical conditions such as heart failure.  Follow these instructions at home: Pay attention to any changes in your symptoms. Take these actions to help with your discomfort:  Take medicines only as told by your health care provider. ? If you were prescribed an antibiotic medicine, take it as told by your health care provider. Do not stop taking the antibiotic even if you start to feel better. ? Talk with your health care provider before you take a cough suppressant medicine.  Drink enough fluid to keep your urine clear or pale yellow.  If the air is  dry, use a cold steam vaporizer or humidifier in your bedroom or your home to help loosen secretions.  Avoid anything that causes you to cough at work or at home.  If your cough is worse at night, try sleeping in a semi-upright position.  Avoid cigarette smoke. If you smoke, quit smoking. If you need help quitting, ask your health care provider.  Avoid caffeine.  Avoid alcohol.  Rest as needed.  Contact a health care provider if:  You have new symptoms.  You cough up pus.  Your cough does not get better after 2-3 weeks, or your cough gets worse.  You cannot control your cough with suppressant medicines and you are losing sleep.  You develop pain that is getting worse or pain that is not controlled with pain medicines.  You have a fever.  You have unexplained weight loss.  You have night sweats. Get help right away if:  You cough up blood.  You have difficulty breathing.  Your heartbeat is very fast. This information is not intended to replace advice given to you by your health care provider. Make sure you discuss any questions you have with your health care provider. Document Released: 04/30/2011 Document Revised: 04/08/2016 Document Reviewed: 01/08/2015 Elsevier Interactive Patient Education  [employer].

## 2018-01-09 ENCOUNTER — Encounter: Payer: Self-pay | Admitting: Primary Care

## 2018-06-01 DIAGNOSIS — H0012 Chalazion right lower eyelid: Secondary | ICD-10-CM | POA: Diagnosis not present

## 2018-06-15 DIAGNOSIS — H0012 Chalazion right lower eyelid: Secondary | ICD-10-CM | POA: Diagnosis not present

## 2018-06-29 DIAGNOSIS — L565 Disseminated superficial actinic porokeratosis (DSAP): Secondary | ICD-10-CM | POA: Diagnosis not present

## 2018-06-29 DIAGNOSIS — L821 Other seborrheic keratosis: Secondary | ICD-10-CM | POA: Diagnosis not present

## 2018-08-02 ENCOUNTER — Encounter: Payer: Medicare HMO | Admitting: Primary Care

## 2018-09-14 ENCOUNTER — Other Ambulatory Visit: Payer: Self-pay | Admitting: Primary Care

## 2018-09-14 DIAGNOSIS — Z0000 Encounter for general adult medical examination without abnormal findings: Secondary | ICD-10-CM

## 2018-09-18 ENCOUNTER — Other Ambulatory Visit (INDEPENDENT_AMBULATORY_CARE_PROVIDER_SITE_OTHER): Payer: Medicare HMO

## 2018-09-18 DIAGNOSIS — Z0000 Encounter for general adult medical examination without abnormal findings: Secondary | ICD-10-CM | POA: Diagnosis not present

## 2018-09-18 LAB — COMPREHENSIVE METABOLIC PANEL
ALT: 15 U/L (ref 0–35)
AST: 21 U/L (ref 0–37)
Albumin: 4.4 g/dL (ref 3.5–5.2)
Alkaline Phosphatase: 55 U/L (ref 39–117)
BUN: 19 mg/dL (ref 6–23)
CO2: 28 mEq/L (ref 19–32)
Calcium: 9.9 mg/dL (ref 8.4–10.5)
Chloride: 103 mEq/L (ref 96–112)
Creatinine, Ser: 0.87 mg/dL (ref 0.40–1.20)
GFR: 68.8 mL/min (ref >60.00)
Glucose, Bld: 84 mg/dL (ref 70–99)
Potassium: 4.5 mEq/L (ref 3.5–5.1)
Sodium: 138 mEq/L (ref 135–145)
Total Bilirubin: 0.6 mg/dL (ref 0.2–1.2)
Total Protein: 7.8 g/dL (ref 6.0–8.3)

## 2018-09-18 LAB — LIPID PANEL
Cholesterol: 195 mg/dL (ref 0–200)
HDL: 79.1 mg/dL (ref >39.00)
LDL Cholesterol: 102 mg/dL — ABNORMAL HIGH (ref 0–99)
NonHDL: 115.41
Total CHOL/HDL Ratio: 2
Triglycerides: 66 mg/dL (ref 0.0–149.0)
VLDL: 13.2 mg/dL (ref 0.0–40.0)

## 2018-09-20 ENCOUNTER — Other Ambulatory Visit: Payer: Medicare HMO

## 2018-09-27 ENCOUNTER — Ambulatory Visit (INDEPENDENT_AMBULATORY_CARE_PROVIDER_SITE_OTHER): Payer: Medicare HMO | Admitting: Primary Care

## 2018-09-27 ENCOUNTER — Encounter: Payer: Self-pay | Admitting: Primary Care

## 2018-09-27 VITALS — BP 120/74 | HR 60 | Temp 98.2°F | Ht 63.0 in | Wt 116.2 lb

## 2018-09-27 DIAGNOSIS — Z1211 Encounter for screening for malignant neoplasm of colon: Secondary | ICD-10-CM | POA: Diagnosis not present

## 2018-09-27 DIAGNOSIS — Z23 Encounter for immunization: Secondary | ICD-10-CM

## 2018-09-27 DIAGNOSIS — M8580 Other specified disorders of bone density and structure, unspecified site: Secondary | ICD-10-CM

## 2018-09-27 DIAGNOSIS — M79673 Pain in unspecified foot: Secondary | ICD-10-CM | POA: Diagnosis not present

## 2018-09-27 DIAGNOSIS — Z0000 Encounter for general adult medical examination without abnormal findings: Secondary | ICD-10-CM | POA: Diagnosis not present

## 2018-09-27 DIAGNOSIS — G8929 Other chronic pain: Secondary | ICD-10-CM | POA: Diagnosis not present

## 2018-09-27 MED ORDER — ZOSTER VAC RECOMB ADJUVANTED 50 MCG/0.5ML IM SUSR: 0.5000 mL | Freq: Once | INTRAMUSCULAR | 1 refills | Status: AC

## 2018-09-27 NOTE — Progress Notes (Signed)
Subjective:    Patient ID: Robyn House, female    DOB: 10/06/1951, 67 y.o.   MRN: [id number]  HPI  Robyn House is a 67 year old female who presents today for complete physical.  Immunizations: -Tetanus: Completed in 2016 -Influenza: Declines -Pneumonia: Completed course -Shingles: Due  Diet: She endorses a healthy diet Breakfast: Cereal, oatmeal  Lunch: Fruit with peanut butter and crackres Dinner: Salad, soup, fish, vegetables  Snacks: Fruit, crackers and cheese Desserts: Infrequent  Beverages: Coffee, water, un-sweet tea, wine nightly  Exercise: She is exercising 3-4 times weekly Eye exam: Completed over one year ago. Dental exam: Completes semi-annually  Colonoscopy: Completed in 2009, due this year. Dexa: Completed in 2019, osteopenia  Pap Smear: Completed in 2017 Mammogram: Completed in January 2019 Hep C Screen: Negative in 2017   Review of Systems  Constitutional: Negative for unexpected weight change.  HENT: Negative for rhinorrhea.   Respiratory: Negative for cough and shortness of breath.   Cardiovascular: Negative for chest pain.  Gastrointestinal: Negative for constipation and diarrhea.  Genitourinary: Negative for difficulty urinating.  Musculoskeletal: Positive for arthralgias.  Skin: Negative for rash.  Allergic/Immunologic: Negative for environmental allergies.  Neurological: Negative for dizziness, numbness and headaches.  Psychiatric/Behavioral: The patient is not nervous/anxious.        Past Medical History:  Diagnosis Date  . Anemia    distant past  . Anorexia    college  . Cervicalgia    Had MRI, seeing neurosurgeon 09/22/15  . Depression   . Hammertoe    LEFT 2ND  . Stiff neck    limited turn to left     Social History   Socioeconomic History  . Marital status: Married    Spouse name: Not on file  . Number of children: Not on file  . Years of education: Not on file  . Highest education level: Not on file  Occupational  History  . Not on file  Social Needs  . Financial resource strain: Not on file  . Food insecurity:    Worry: Not on file    Inability: Not on file  . Transportation needs:    Medical: Not on file    Non-medical: Not on file  Tobacco Use  . Smoking status: Never Smoker  . Smokeless tobacco: Never Used  Substance and Sexual Activity  . Alcohol use: Yes    Alcohol/week: 7.0 standard drinks    Types: 7 Glasses of wine per week  . Drug use: No  . Sexual activity: Not on file  Lifestyle  . Physical activity:    Days per week: Not on file    Minutes per session: Not on file  . Stress: Not on file  Relationships  . Social connections:    Talks on phone: Not on file    Gets together: Not on file    Attends religious service: Not on file    Active member of club or organization: Not on file    Attends meetings of clubs or organizations: Not on file    Relationship status: Not on file  . Intimate partner violence:    Fear of current or ex partner: Not on file    Emotionally abused: Not on file    Physically abused: Not on file    Forced sexual activity: Not on file  Other Topics Concern  . Not on file  Social History Narrative   Lives in [place] with husband.    Has 3 children and  1 step son, 54, 81, 23, 60. 1 son in [place], step son in [place]. 2 sons in [place].    No pets.   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     Past Surgical History:  Procedure Laterality Date  . CORRECTION OVERLAPPING TOES Left 09/03/2015   Procedure: CORRECTION OVERLAPPING TOE LEFT SECOND TOE;  Surgeon: Samara Deist, DPM;  Location: [facility];  Service: Podiatry;  Laterality: Left;  . HALLUX VALGUS LAPIDUS Left 09/03/2015   Procedure: HALLUX VALGUS LAPIDUS LEFT FOOT;  Surgeon: Samara Deist, DPM;  Location: [facility];  Service: Podiatry;  Laterality:  Left;  WITH POPLITEAL 2ND CASE PER SCHEDULING SHEET  . HAMMER TOE SURGERY Left 09/03/2015   Procedure: HAMMER TOE CORRECTION LEFT SECOND TOE;  Surgeon: Samara Deist, DPM;  Location: [facility];  Service: Podiatry;  Laterality: Left;  . INNER EAR SURGERY     Right ear drum collapsed, Dr. Tami Ribas  . SHOULDER SURGERY Right    Rotator cuff repair  . TONSILECTOMY/ADENOIDECTOMY WITH MYRINGOTOMY      Family History  Problem Relation Age of Onset  . Stroke Mother   . Heart disease Mother        arrhythmia  . Stroke Father   . Arthritis Maternal Grandmother   . Heart disease Maternal Grandfather   . Breast cancer Other 39    No Known Allergies  Current Outpatient Medications on File Prior to Visit  Medication Sig Dispense Refill  . meloxicam (MOBIC) 15 MG tablet meloxicam 15 mg tablet  Take 1 tablet every day by oral route.    . Cholecalciferol (VITAMIN D-3 PO) Take by mouth.    . gabapentin (NEURONTIN) 100 MG capsule gabapentin 100 mg capsule  TAKE 1 CAPSULE BY MOUTH EVERY MORNING AND 2 CAPSULES AT BEDTIME    . MULTIPLE VITAMINS PO Take by mouth.     No current facility-administered medications on file prior to visit.     BP 120/74   Pulse 60   Temp 98.2 F (36.8 C) (Oral)   Ht 5\' 3"  (1.6 m)   Wt 116 lb 4 oz (52.7 kg)   SpO2 98%   BMI 20.59 kg/m    Objective:   Physical Exam  Constitutional: She is oriented to person, place, and time. She appears well-nourished.  HENT:  Mouth/Throat: No oropharyngeal exudate.  Eyes: Pupils are equal, round, and reactive to light. EOM are normal.  Neck: Neck supple. No thyromegaly present.  Cardiovascular: Normal rate and regular rhythm.  Respiratory: Effort normal and breath sounds normal.  GI: Soft. Bowel sounds are normal. There is no tenderness.  Musculoskeletal: Normal range of motion.  Neurological: She is alert and oriented to person, place, and time.  Skin: Skin is warm and dry.  Psychiatric: She has a normal mood  and affect.           Assessment & Plan:

## 2018-09-27 NOTE — Assessment & Plan Note (Signed)
Immunizations UTD. Rx for Shingrix printed and provided. Colonoscopy due, referral placed. Bone density and mammogram UTD. Commended her on a healthy diet and regular exercise, encouraged to continue. Exam unremarkable. Labs reviewed. Follow up in 1 year for CPE.

## 2018-09-27 NOTE — Assessment & Plan Note (Signed)
Chronic to left second toe, history of hammer toe and bunion. Compliant to gabapentin 100 mg in the morning and 200 mg in the evening. Overall doing well.

## 2018-09-27 NOTE — Patient Instructions (Signed)
Continue exercising. You should be getting 150 minutes of moderate intensity exercise weekly.  Continue to eat a healthy diet.   Continue taking Calcium and Vitamin D.   Take the shingles vaccination to your pharmacy as discussed.  We will see you in one year for your annual exam or sooner if needed.  It was a pleasure to see you today!   Preventive Care 68 Years and Older, Female Preventive care refers to lifestyle choices and visits with your health care provider that can promote health and wellness. What does preventive care include?  A yearly physical exam. This is also called an annual well check.  Dental exams once or twice a year.  Routine eye exams. Ask your health care provider how often you should have your eyes checked.  Personal lifestyle choices, including: ? Daily care of your teeth and gums. ? Regular physical activity. ? Eating a healthy diet. ? Avoiding tobacco and drug use. ? Limiting alcohol use. ? Practicing safe sex. ? Taking low-dose aspirin every day. ? Taking vitamin and mineral supplements as recommended by your health care provider. What happens during an annual well check? The services and screenings done by your health care provider during your annual well check will depend on your age, overall health, lifestyle risk factors, and family history of disease. Counseling Your health care provider may ask you questions about your:  Alcohol use.  Tobacco use.  Drug use.  Emotional well-being.  Home and relationship well-being.  Sexual activity.  Eating habits.  History of falls.  Memory and ability to understand (cognition).  Work and work Statistician.  Reproductive health.  Screening You may have the following tests or measurements:  Height, weight, and BMI.  Blood pressure.  Lipid and cholesterol levels. These may be checked every 5 years, or more frequently if you are over 31 years old.  Skin check.  Lung cancer screening.  You may have this screening every year starting at age 70 if you have a 30-pack-year history of smoking and currently smoke or have quit within the past 15 years.  Fecal occult blood test (FOBT) of the stool. You may have this test every year starting at age 18.  Flexible sigmoidoscopy or colonoscopy. You may have a sigmoidoscopy every 5 years or a colonoscopy every 10 years starting at age 55.  Hepatitis C blood test.  Hepatitis B blood test.  Sexually transmitted disease (STD) testing.  Diabetes screening. This is done by checking your blood sugar (glucose) after you have not eaten for a while (fasting). You may have this done every 1-3 years.  Bone density scan. This is done to screen for osteoporosis. You may have this done starting at age 76.  Mammogram. This may be done every 1-2 years. Talk to your health care provider about how often you should have regular mammograms.  Talk with your health care provider about your test results, treatment options, and if necessary, the need for more tests. Vaccines Your health care provider may recommend certain vaccines, such as:  Influenza vaccine. This is recommended every year.  Tetanus, diphtheria, and acellular pertussis (Tdap, Td) vaccine. You may need a Td booster every 10 years.  Varicella vaccine. You may need this if you have not been vaccinated.  Zoster vaccine. You may need this after age 21.  Measles, mumps, and rubella (MMR) vaccine. You may need at least one dose of MMR if you were born in 1957 or later. You may also need a second  dose.  Pneumococcal 13-valent conjugate (PCV13) vaccine. One dose is recommended after age 65.  Pneumococcal polysaccharide (PPSV23) vaccine. One dose is recommended after age 65.  Meningococcal vaccine. You may need this if you have certain conditions.  Hepatitis A vaccine. You may need this if you have certain conditions or if you travel or work in places where you may be exposed to hepatitis  A.  Hepatitis B vaccine. You may need this if you have certain conditions or if you travel or work in places where you may be exposed to hepatitis B.  Haemophilus influenzae type b (Hib) vaccine. You may need this if you have certain conditions.  Talk to your health care provider about which screenings and vaccines you need and how often you need them. This information is not intended to replace advice given to you by your health care provider. Make sure you discuss any questions you have with your health care provider. Document Released: 11/28/2015 Document Revised: 07/21/2016 Document Reviewed: 09/02/2015 Elsevier Interactive Patient Education  2018 Elsevier Inc.  

## 2018-09-27 NOTE — Assessment & Plan Note (Signed)
Compliant to calcium and vitamin D. Bone density scan is up to to date, due in 2021.

## 2018-11-20 DIAGNOSIS — Z1211 Encounter for screening for malignant neoplasm of colon: Secondary | ICD-10-CM | POA: Diagnosis not present

## 2018-11-20 DIAGNOSIS — G8929 Other chronic pain: Secondary | ICD-10-CM | POA: Diagnosis not present

## 2018-11-20 DIAGNOSIS — M542 Cervicalgia: Secondary | ICD-10-CM | POA: Diagnosis not present

## 2018-11-27 ENCOUNTER — Ambulatory Visit (INDEPENDENT_AMBULATORY_CARE_PROVIDER_SITE_OTHER): Payer: Medicare HMO | Admitting: Family Medicine

## 2018-11-27 ENCOUNTER — Ambulatory Visit (INDEPENDENT_AMBULATORY_CARE_PROVIDER_SITE_OTHER)
Admission: RE | Admit: 2018-11-27 | Discharge: 2018-11-27 | Disposition: A | Discharge status: Home / Self Care | Payer: Medicare HMO | Source: Ambulatory Visit | Attending: Family Medicine | Admitting: Family Medicine

## 2018-11-27 ENCOUNTER — Encounter: Payer: Self-pay | Admitting: Family Medicine

## 2018-11-27 VITALS — BP 142/72 | HR 101 | Temp 100.1°F | Ht 63.0 in | Wt 116.2 lb

## 2018-11-27 DIAGNOSIS — R059 Cough, unspecified: Secondary | ICD-10-CM

## 2018-11-27 DIAGNOSIS — R05 Cough: Secondary | ICD-10-CM | POA: Diagnosis not present

## 2018-11-27 DIAGNOSIS — J189 Pneumonia, unspecified organism: Secondary | ICD-10-CM | POA: Diagnosis not present

## 2018-11-27 LAB — POC INFLUENZA A&B (BINAX/QUICKVUE)
Influenza A, POC: NEGATIVE
Influenza B, POC: NEGATIVE

## 2018-11-27 MED ORDER — AZITHROMYCIN 250 MG PO TABS: ORAL_TABLET | ORAL | 0 refills | Status: DC

## 2018-11-27 MED ORDER — ALBUTEROL SULFATE HFA 108 (90 BASE) MCG/ACT IN AERS: 1.0000 | INHALATION_SPRAY | Freq: Four times a day (QID) | RESPIRATORY_TRACT | 0 refills | Status: DC | PRN

## 2018-11-27 MED ORDER — AMOXICILLIN-POT CLAVULANATE 875-125 MG PO TABS: 1.0000 | ORAL_TABLET | Freq: Two times a day (BID) | ORAL | 0 refills | Status: DC

## 2018-11-27 NOTE — Patient Instructions (Signed)
Start both antibiotics.  Use the inhaler if needed.  Rest and fluids.  If any worse in the meantime, then dial 911 or go to the ER.  Take care.  Glad to see you.  Update [place] in 24-48 hours, sooner if needed.   You'll need a repeat chest xray in about 1 month to demonstrate resolution.

## 2018-11-27 NOTE — Progress Notes (Signed)
Taking baseline meds with dayquil/nyquil as needed.    Sx started about 9 days ago with cough, congestion.  Family members had been sick concurrently.  Sx got worse about 2 days ago.  More cough, now with fatigue.  Some sputum, was clear, now discolored.  Some nausea and chills today.  Possible wheeze noted by patient.    H/o PNA ~2 years ago.  No diffuse aches until today.  Now with aches and chest is sore with deep breath.    Meds, vitals, and allergies reviewed.   ROS: Per HPI unless specifically indicated in ROS section   GEN: nad, alert and oriented HEENT: mucous membranes moist, tm w/o erythema, nasal exam w/o erythema, clear discharge noted,  OP with cobblestoning NECK: supple w/o LA CV: rrr.   PULM: diffuse exp rhonchi, mild inc wob, cough noted.  No focal dec in BS, speaking in complete sentences.   EXT: no edema SKIN: well perfused.   Flu neg.  cxr reviewed independently.  Minimal patchy density in the lingula consistent with mild pneumonia.  Agree with overread.   D/w pt at OV.

## 2018-11-28 DIAGNOSIS — J189 Pneumonia, unspecified organism: Secondary | ICD-10-CM

## 2018-11-28 HISTORY — DX: Pneumonia, unspecified organism: J18.9

## 2018-11-28 NOTE — Assessment & Plan Note (Signed)
D/w pt about cxr at ov.  Discussed options.  Still speaking in complete sentences.  Still okay for outpatient f/u.  Start augmentin and zmax.  Use SABA if needed.  Rest and fluids.  If any worse in the meantime, then dial 911 or go to the ER.  Update [place] in 24-48 hours, sooner if needed.   She'll need a repeat chest xray in about 1 month to demonstrate resolution.  D/w pt at OV.    F/u CXR ordered.    >25 minutes spent in face to face time with patient, >50% spent in counselling or coordination of care.

## 2018-12-25 DIAGNOSIS — Z1211 Encounter for screening for malignant neoplasm of colon: Secondary | ICD-10-CM | POA: Diagnosis not present

## 2018-12-25 DIAGNOSIS — K640 First degree hemorrhoids: Secondary | ICD-10-CM | POA: Diagnosis not present

## 2018-12-29 ENCOUNTER — Ambulatory Visit (INDEPENDENT_AMBULATORY_CARE_PROVIDER_SITE_OTHER)
Admission: RE | Admit: 2018-12-29 | Discharge: 2018-12-29 | Disposition: A | Discharge status: Home / Self Care | Payer: Medicare HMO | Source: Ambulatory Visit | Attending: Family Medicine | Admitting: Family Medicine

## 2018-12-29 DIAGNOSIS — J189 Pneumonia, unspecified organism: Secondary | ICD-10-CM

## 2019-01-09 ENCOUNTER — Other Ambulatory Visit: Payer: Self-pay | Admitting: Primary Care

## 2019-01-09 DIAGNOSIS — Z1231 Encounter for screening mammogram for malignant neoplasm of breast: Secondary | ICD-10-CM

## 2019-01-24 ENCOUNTER — Ambulatory Visit
Admission: RE | Admit: 2019-01-24 | Discharge: 2019-01-24 | Disposition: A | Discharge status: Home / Self Care | Payer: Medicare HMO | Source: Ambulatory Visit | Attending: Primary Care | Admitting: Primary Care

## 2019-01-24 ENCOUNTER — Other Ambulatory Visit: Payer: Self-pay

## 2019-01-24 DIAGNOSIS — Z1231 Encounter for screening mammogram for malignant neoplasm of breast: Secondary | ICD-10-CM | POA: Diagnosis not present

## 2019-06-26 DIAGNOSIS — H25813 Combined forms of age-related cataract, bilateral: Secondary | ICD-10-CM | POA: Diagnosis not present

## 2019-06-26 DIAGNOSIS — Z0100 Encounter for examination of eyes and vision without abnormal findings: Secondary | ICD-10-CM | POA: Diagnosis not present

## 2019-07-04 DIAGNOSIS — D2261 Melanocytic nevi of right upper limb, including shoulder: Secondary | ICD-10-CM | POA: Diagnosis not present

## 2019-07-04 DIAGNOSIS — D2262 Melanocytic nevi of left upper limb, including shoulder: Secondary | ICD-10-CM | POA: Diagnosis not present

## 2019-07-04 DIAGNOSIS — D2271 Melanocytic nevi of right lower limb, including hip: Secondary | ICD-10-CM | POA: Diagnosis not present

## 2019-07-04 DIAGNOSIS — D225 Melanocytic nevi of trunk: Secondary | ICD-10-CM | POA: Diagnosis not present

## 2019-07-04 DIAGNOSIS — L821 Other seborrheic keratosis: Secondary | ICD-10-CM | POA: Diagnosis not present

## 2019-07-04 DIAGNOSIS — D2272 Melanocytic nevi of left lower limb, including hip: Secondary | ICD-10-CM | POA: Diagnosis not present

## 2019-09-06 DIAGNOSIS — R69 Illness, unspecified: Secondary | ICD-10-CM | POA: Diagnosis not present

## 2019-09-26 ENCOUNTER — Other Ambulatory Visit: Payer: Self-pay | Admitting: Primary Care

## 2019-09-26 DIAGNOSIS — Z13220 Encounter for screening for lipoid disorders: Secondary | ICD-10-CM

## 2019-09-30 DIAGNOSIS — Z1159 Encounter for screening for other viral diseases: Secondary | ICD-10-CM | POA: Diagnosis not present

## 2019-10-03 ENCOUNTER — Ambulatory Visit (INDEPENDENT_AMBULATORY_CARE_PROVIDER_SITE_OTHER): Payer: Medicare HMO

## 2019-10-03 ENCOUNTER — Other Ambulatory Visit (INDEPENDENT_AMBULATORY_CARE_PROVIDER_SITE_OTHER): Payer: Medicare HMO

## 2019-10-03 ENCOUNTER — Ambulatory Visit: Payer: Medicare HMO

## 2019-10-03 DIAGNOSIS — Z0000 Encounter for general adult medical examination without abnormal findings: Secondary | ICD-10-CM

## 2019-10-03 DIAGNOSIS — Z13220 Encounter for screening for lipoid disorders: Secondary | ICD-10-CM | POA: Diagnosis not present

## 2019-10-03 LAB — COMPREHENSIVE METABOLIC PANEL
ALT: 18 U/L (ref 0–35)
AST: 24 U/L (ref 0–37)
Albumin: 4.3 g/dL (ref 3.5–5.2)
Alkaline Phosphatase: 52 U/L (ref 39–117)
BUN: 16 mg/dL (ref 6–23)
CO2: 29 mEq/L (ref 19–32)
Calcium: 9.6 mg/dL (ref 8.4–10.5)
Chloride: 102 mEq/L (ref 96–112)
Creatinine, Ser: 0.73 mg/dL (ref 0.40–1.20)
GFR: 79.02 mL/min (ref >60.00)
Glucose, Bld: 86 mg/dL (ref 70–99)
Potassium: 4 mEq/L (ref 3.5–5.1)
Sodium: 137 mEq/L (ref 135–145)
Total Bilirubin: 0.6 mg/dL (ref 0.2–1.2)
Total Protein: 7.4 g/dL (ref 6.0–8.3)

## 2019-10-03 LAB — LIPID PANEL
Cholesterol: 201 mg/dL — ABNORMAL HIGH (ref 0–200)
HDL: 70.5 mg/dL (ref >39.00)
LDL Cholesterol: 116 mg/dL — ABNORMAL HIGH (ref 0–99)
NonHDL: 130.38
Total CHOL/HDL Ratio: 3
Triglycerides: 70 mg/dL (ref 0.0–149.0)
VLDL: 14 mg/dL (ref 0.0–40.0)

## 2019-10-03 NOTE — Patient Instructions (Signed)
Robyn House , Thank you for taking time to come for your Medicare Wellness Visit. I appreciate your ongoing commitment to your health goals. Please review the following plan we discussed and let me know if I can assist you in the future.   Screening recommendations/referrals: Colonoscopy: Up to date, completed 12/25/2018 Mammogram: Up to date, completed 01/24/2019 Bone Density: Up to date, completed 12/15/2017 Recommended yearly ophthalmology/optometry visit for glaucoma screening and checkup Recommended yearly dental visit for hygiene and checkup  Vaccinations: Influenza vaccine: will discuss with provider first Pneumococcal vaccine: Completed series Tdap vaccine: Up to date, completed 07/11/2015 Shingles vaccine: Completed series    Advanced directives: Advance directive discussed with you today. I have provided a copy for you to complete at home and have notarized. Once this is complete please bring a copy in to our office so we can scan it into your chart.  Conditions/risks identified: none  Next appointment: 10/08/2019 @ 9 am    Preventive Care 65 Years and Older, Female Preventive care refers to lifestyle choices and visits with your health care provider that can promote health and wellness. What does preventive care include?  A yearly physical exam. This is also called an annual well check.  Dental exams once or twice a year.  Routine eye exams. Ask your health care provider how often you should have your eyes checked.  Personal lifestyle choices, including:  Daily care of your teeth and gums.  Regular physical activity.  Eating a healthy diet.  Avoiding tobacco and drug use.  Limiting alcohol use.  Practicing safe sex.  Taking low-dose aspirin every day.  Taking vitamin and mineral supplements as recommended by your health care provider. What happens during an annual well check? The services and screenings done by your health care provider during your annual  well check will depend on your age, overall health, lifestyle risk factors, and family history of disease. Counseling  Your health care provider may ask you questions about your:  Alcohol use.  Tobacco use.  Drug use.  Emotional well-being.  Home and relationship well-being.  Sexual activity.  Eating habits.  History of falls.  Memory and ability to understand (cognition).  Work and work Statistician.  Reproductive health. Screening  You may have the following tests or measurements:  Height, weight, and BMI.  Blood pressure.  Lipid and cholesterol levels. These may be checked every 5 years, or more frequently if you are over 82 years old.  Skin check.  Lung cancer screening. You may have this screening every year starting at age 6 if you have a 30-pack-year history of smoking and currently smoke or have quit within the past 15 years.  Fecal occult blood test (FOBT) of the stool. You may have this test every year starting at age 62.  Flexible sigmoidoscopy or colonoscopy. You may have a sigmoidoscopy every 5 years or a colonoscopy every 10 years starting at age 34.  Hepatitis C blood test.  Hepatitis B blood test.  Sexually transmitted disease (STD) testing.  Diabetes screening. This is done by checking your blood sugar (glucose) after you have not eaten for a while (fasting). You may have this done every 1-3 years.  Bone density scan. This is done to screen for osteoporosis. You may have this done starting at age 66.  Mammogram. This may be done every 1-2 years. Talk to your health care provider about how often you should have regular mammograms. Talk with your health care provider about your test results,  treatment options, and if necessary, the need for more tests. Vaccines  Your health care provider may recommend certain vaccines, such as:  Influenza vaccine. This is recommended every year.  Tetanus, diphtheria, and acellular pertussis (Tdap, Td) vaccine.  You may need a Td booster every 10 years.  Zoster vaccine. You may need this after age 50.  Pneumococcal 13-valent conjugate (PCV13) vaccine. One dose is recommended after age 67.  Pneumococcal polysaccharide (PPSV23) vaccine. One dose is recommended after age 84. Talk to your health care provider about which screenings and vaccines you need and how often you need them. This information is not intended to replace advice given to you by your health care provider. Make sure you discuss any questions you have with your health care provider. Document Released: 11/28/2015 Document Revised: 07/21/2016 Document Reviewed: 09/02/2015 Elsevier Interactive Patient Education  2017 [employer] Prevention in the Home Falls can cause injuries. They can happen to people of all ages. There are many things you can do to make your home safe and to help prevent falls. What can I do on the outside of my home?  Regularly fix the edges of walkways and driveways and fix any cracks.  Remove anything that might make you trip as you walk through a door, such as a raised step or threshold.  Trim any bushes or trees on the path to your home.  Use bright outdoor lighting.  Clear any walking paths of anything that might make someone trip, such as rocks or tools.  Regularly check to see if handrails are loose or broken. Make sure that both sides of any steps have handrails.  Any raised decks and porches should have guardrails on the edges.  Have any leaves, snow, or ice cleared regularly.  Use sand or salt on walking paths during winter.  Clean up any spills in your garage right away. This includes oil or grease spills. What can I do in the bathroom?  Use night lights.  Install grab bars by the toilet and in the tub and shower. Do not use towel bars as grab bars.  Use non-skid mats or decals in the tub or shower.  If you need to sit down in the shower, use a plastic, non-slip stool.  Keep the  floor dry. Clean up any water that spills on the floor as soon as it happens.  Remove soap buildup in the tub or shower regularly.  Attach bath mats securely with double-sided non-slip rug tape.  Do not have throw rugs and other things on the floor that can make you trip. What can I do in the bedroom?  Use night lights.  Make sure that you have a light by your bed that is easy to reach.  Do not use any sheets or blankets that are too big for your bed. They should not hang down onto the floor.  Have a firm chair that has side arms. You can use this for support while you get dressed.  Do not have throw rugs and other things on the floor that can make you trip. What can I do in the kitchen?  Clean up any spills right away.  Avoid walking on wet floors.  Keep items that you use a lot in easy-to-reach places.  If you need to reach something above you, use a strong step stool that has a grab bar.  Keep electrical cords out of the way.  Do not use floor polish or wax that makes floors  slippery. If you must use wax, use non-skid floor wax.  Do not have throw rugs and other things on the floor that can make you trip. What can I do with my stairs?  Do not leave any items on the stairs.  Make sure that there are handrails on both sides of the stairs and use them. Fix handrails that are broken or loose. Make sure that handrails are as long as the stairways.  Check any carpeting to make sure that it is firmly attached to the stairs. Fix any carpet that is loose or worn.  Avoid having throw rugs at the top or bottom of the stairs. If you do have throw rugs, attach them to the floor with carpet tape.  Make sure that you have a light switch at the top of the stairs and the bottom of the stairs. If you do not have them, ask someone to add them for you. What else can I do to help prevent falls?  Wear shoes that:  Do not have high heels.  Have rubber bottoms.  Are comfortable and fit  you well.  Are closed at the toe. Do not wear sandals.  If you use a stepladder:  Make sure that it is fully opened. Do not climb a closed stepladder.  Make sure that both sides of the stepladder are locked into place.  Ask someone to hold it for you, if possible.  Clearly mark and make sure that you can see:  Any grab bars or handrails.  First and last steps.  Where the edge of each step is.  Use tools that help you move around (mobility aids) if they are needed. These include:  Canes.  Walkers.  Scooters.  Crutches.  Turn on the lights when you go into a dark area. Replace any light bulbs as soon as they burn out.  Set up your furniture so you have a clear path. Avoid moving your furniture around.  If any of your floors are uneven, fix them.  If there are any pets around you, be aware of where they are.  Review your medicines with your doctor. Some medicines can make you feel dizzy. This can increase your chance of falling. Ask your doctor what other things that you can do to help prevent falls. This information is not intended to replace advice given to you by your health care provider. Make sure you discuss any questions you have with your health care provider. Document Released: 08/28/2009 Document Revised: 04/08/2016 Document Reviewed: 12/06/2014 Elsevier Interactive Patient Education  2017 [employer].

## 2019-10-03 NOTE — Progress Notes (Signed)
PCP notes:  Health Maintenance: Discuss flu vaccine with provider   Abnormal Screenings: none   Patient concerns: none   Nurse concerns: none   Next PCP appt.: 10/08/2019 @ 9 am

## 2019-10-03 NOTE — Progress Notes (Signed)
Subjective:   Robyn House is a 68 y.o. female who presents for Medicare Annual (Subsequent) preventive examination.  Review of Systems: N/A   This visit is being conducted through telemedicine via telephone at the nurse health advisor's home address due to the COVID-19 pandemic. This patient has given me verbal consent via doximity to conduct this visit, patient states they are participating from their home address. Patient and myself are on the telephone call. There is no referral for this visit. Some vital signs may be absent or patient reported.    Patient identification: identified by name, DOB, and current address   Cardiac Risk Factors include: advanced age (>87men, >55 women)     Objective:     Vitals: There were no vitals taken for this visit.  There is no height or weight on file to calculate BMI.  Advanced Directives 10/03/2019  Does Patient Have a Medical Advance Directive? No  Would patient like information on creating a medical advance directive? Yes (MAU/Ambulatory/Procedural Areas - Information given)    Tobacco Social History   Tobacco Use  Smoking Status Never Smoker  Smokeless Tobacco Never Used     Counseling given: Not Answered   Clinical Intake:  Pre-visit preparation completed: Yes  Pain : No/denies pain     Nutritional Risks: None Diabetes: No  How often do you need to have someone help you when you read instructions, pamphlets, or other written materials from your doctor or pharmacy?: 1 - Never What is the last grade level you completed in school?: 1 year of college  Interpreter Needed?: No  Information entered by :: CJohnson, LPN  Past Medical History:  Diagnosis Date  . Anemia    distant past  . Anorexia    college  . Cervicalgia    Had MRI, seeing neurosurgeon 09/22/15  . Depression   . Hammertoe    LEFT 2ND  . Stiff neck    limited turn to left   Past Surgical History:  Procedure Laterality Date  . CORRECTION  OVERLAPPING TOES Left 09/03/2015   Procedure: CORRECTION OVERLAPPING TOE LEFT SECOND TOE;  Surgeon: Samara Deist, DPM;  Location: [facility];  Service: Podiatry;  Laterality: Left;  . HALLUX VALGUS LAPIDUS Left 09/03/2015   Procedure: HALLUX VALGUS LAPIDUS LEFT FOOT;  Surgeon: Samara Deist, DPM;  Location: [facility];  Service: Podiatry;  Laterality: Left;  WITH POPLITEAL 2ND CASE PER SCHEDULING SHEET  . HAMMER TOE SURGERY Left 09/03/2015   Procedure: HAMMER TOE CORRECTION LEFT SECOND TOE;  Surgeon: Samara Deist, DPM;  Location: [facility];  Service: Podiatry;  Laterality: Left;  . INNER EAR SURGERY     Right ear drum collapsed, Dr. Tami Ribas  . SHOULDER SURGERY Right    Rotator cuff repair  . TONSILECTOMY/ADENOIDECTOMY WITH MYRINGOTOMY     Family History  Problem Relation Age of Onset  . Stroke Mother   . Heart disease Mother        arrhythmia  . Stroke Father   . Arthritis Maternal Grandmother   . Heart disease Maternal Grandfather   . Breast cancer Other 71   Social History   Socioeconomic History  . Marital status: Married    Spouse name: Not on file  . Number of children: Not on file  . Years of education: Not on file  . Highest education level: Not on file  Occupational History  . Not on file  Social Needs  . Financial resource strain: Not hard at all  .  Food insecurity    Worry: Never true    Inability: Never true  . Transportation needs    Medical: No    Non-medical: No  Tobacco Use  . Smoking status: Never Smoker  . Smokeless tobacco: Never Used  Substance and Sexual Activity  . Alcohol use: Yes    Alcohol/week: 7.0 standard drinks    Types: 7 Glasses of wine per week  . Drug use: No  . Sexual activity: Not on file  Lifestyle  . Physical activity    Days per week: 2 days    Minutes per session: 60 min  . Stress: Not at all  Relationships  . Social Herbalist on phone: Not on file    Gets together: Not on file     Attends religious service: Not on file    Active member of club or organization: Not on file    Attends meetings of clubs or organizations: Not on file    Relationship status: Not on file  Other Topics Concern  . Not on file  Social History Narrative   Lives in [place] with husband.    Has 3 children and 1 step son, 30, 66, 9, 13. 1 son in [place], step son in [place]. 2 sons in [place].    No pets.   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     Outpatient Encounter Medications as of 10/03/2019  Medication Sig  . albuterol (PROVENTIL HFA;VENTOLIN HFA) 108 (90 Base) MCG/ACT inhaler Inhale 1-2 puffs into the lungs every 6 (six) hours as needed (for cough).  . Cholecalciferol (VITAMIN D-3 PO) Take by mouth.  . gabapentin (NEURONTIN) 100 MG capsule gabapentin 100 mg capsule  TAKE 1 CAPSULE BY MOUTH EVERY MORNING AND 2 CAPSULES AT BEDTIME  . MULTIPLE VITAMINS PO Take by mouth.  Marland Kitchen amoxicillin-clavulanate (AUGMENTIN) 875-125 MG tablet Take 1 tablet by mouth 2 (two) times daily. (Patient not taking: Reported on 10/03/2019)  . azithromycin (ZITHROMAX) 250 MG tablet 2 tabs a day for 1 day and then 1 a day for 4 days. (Patient not taking: Reported on 10/03/2019)  . meloxicam (MOBIC) 15 MG tablet meloxicam 15 mg tablet  Take 1 tablet every day by oral route.   No facility-administered encounter medications on file as of 10/03/2019.     Activities of Daily Living In your present state of health, do you have any difficulty performing the following activities: 10/03/2019  Hearing? Y  Comment some right ear hearing issues  Vision? N  Difficulty concentrating or making decisions? N  Walking or climbing stairs? N  Dressing or bathing? N  Doing errands, shopping? N  Preparing Food and eating ? N  Using the Toilet? N  In the past six months, have you accidently leaked  urine? Y  Comment Patient wears pantyliner  Do you have problems with loss of bowel control? N  Managing your Medications? N  Managing your Finances? N  Housekeeping or managing your Housekeeping? N  Some recent data might be hidden    Patient Care Team: Pleas Koch, NP as PCP - General (Internal Medicine)    Assessment:   This is a routine wellness examination for [place].  Exercise Activities and Dietary recommendations Current Exercise Habits: Structured exercise class, Type of exercise: yoga;walking, Time (Minutes): 30, Frequency (Times/Week): 7, Weekly Exercise (Minutes/Week): 210,  Intensity: Moderate, Exercise limited by: None identified  Goals    . Patient Stated     10/03/2019, I will maintain and continue medications as prescribed.        Fall Risk Fall Risk  10/03/2019 07/21/2017  Falls in the past year? 0 No  Number falls in past yr: 0 -  Injury with Fall? 0 -  Follow up Falls evaluation completed;Falls prevention discussed -   Is the patient's home free of loose throw rugs in walkways, pet beds, electrical cords, etc?   yes      Grab bars in the bathroom? no      Handrails on the stairs?   yes      Adequate lighting?   yes  Timed Get Up and Go performed: N/A  Depression Screen PHQ 2/9 Scores 10/03/2019 07/21/2017 07/15/2016  PHQ - 2 Score 0 0 0  PHQ- 9 Score 0 - -     Cognitive Function MMSE - Mini Mental State Exam 10/03/2019  Orientation to time 5  Orientation to Place 5  Registration 3  Attention/ Calculation 5  Recall 3  Language- repeat 1       Mini Cog  Mini-Cog screen was completed. Maximum score is 22. A value of 0 denotes this part of the MMSE was not completed or the patient failed this part of the Mini-Cog screening.  Immunization History  Administered Date(s) Administered  . Pneumococcal Conjugate-13 07/15/2016  . Pneumococcal Polysaccharide-23 07/21/2017  . Tdap 07/11/2015  . Zoster Recombinat (Shingrix) 09/27/2018, 12/27/2018     Qualifies for Shingles Vaccine?Completed series   Screening Tests Health Maintenance  Topic Date Due  . INFLUENZA VACCINE  06/16/2019  . MAMMOGRAM  01/23/2021  . TETANUS/TDAP  07/10/2025  . COLONOSCOPY  12/25/2028  . DEXA SCAN  Completed  . Hepatitis C Screening  Completed  . PNA vac Low Risk Adult  Completed    Cancer Screenings: Lung: Low Dose CT Chest recommended if Age 70-80 years, 30 pack-year currently smoking OR have quit w/in 15years. Patient does not qualify. Breast:  Up to date on Mammogram? Yes, completed 01/24/2019   Up to date of Bone Density/Dexa? Yes, completed 12/15/2017 Colorectal: completed 12/25/2018  Additional Screenings:  Hepatitis C Screening: 07/08/2016     Plan:    Patient will maintain and continue medications as prescribed.    I have personally reviewed and noted the following in the patient's chart:   . Medical and social history . Use of alcohol, tobacco or illicit drugs  . Current medications and supplements . Functional ability and status . Nutritional status . Physical activity . Advanced directives . List of other physicians . Hospitalizations, surgeries, and ER visits in previous 12 months . Vitals . Screenings to include cognitive, depression, and falls . Referrals and appointments  In addition, I have reviewed and discussed with patient certain preventive protocols, quality metrics, and best practice recommendations. A written personalized care plan for preventive services as well as general preventive health recommendations were provided to patient.     Andrez Grime, LPN  [phone]

## 2019-10-08 ENCOUNTER — Ambulatory Visit (INDEPENDENT_AMBULATORY_CARE_PROVIDER_SITE_OTHER): Payer: Medicare HMO | Admitting: Primary Care

## 2019-10-08 ENCOUNTER — Other Ambulatory Visit: Payer: Self-pay

## 2019-10-08 ENCOUNTER — Encounter: Payer: Self-pay | Admitting: Primary Care

## 2019-10-08 VITALS — BP 122/72 | HR 54 | Temp 96.6°F | Ht 63.0 in | Wt 115.2 lb

## 2019-10-08 DIAGNOSIS — Z0000 Encounter for general adult medical examination without abnormal findings: Secondary | ICD-10-CM | POA: Diagnosis not present

## 2019-10-08 DIAGNOSIS — G8929 Other chronic pain: Secondary | ICD-10-CM | POA: Diagnosis not present

## 2019-10-08 DIAGNOSIS — M8580 Other specified disorders of bone density and structure, unspecified site: Secondary | ICD-10-CM

## 2019-10-08 DIAGNOSIS — M79673 Pain in unspecified foot: Secondary | ICD-10-CM

## 2019-10-08 NOTE — Patient Instructions (Signed)
Continue exercising. You should be getting 150 minutes of moderate intensity exercise weekly.  Continue to work on a healthy diet. Ensure you are consuming 64 ounces of water daily.  Continue taking calcium and vitamin D.  It was a pleasure to see you today!   Preventive Care 100 Years and Older, Female Preventive care refers to lifestyle choices and visits with your health care provider that can promote health and wellness. This includes:  A yearly physical exam. This is also called an annual well check.  Regular dental and eye exams.  Immunizations.  Screening for certain conditions.  Healthy lifestyle choices, such as diet and exercise. What can I expect for my preventive care visit? Physical exam Your health care provider will check:  Height and weight. These may be used to calculate body mass index (BMI), which is a measurement that tells if you are at a healthy weight.  Heart rate and blood pressure.  Your skin for abnormal spots. Counseling Your health care provider may ask you questions about:  Alcohol, tobacco, and drug use.  Emotional well-being.  Home and relationship well-being.  Sexual activity.  Eating habits.  History of falls.  Memory and ability to understand (cognition).  Work and work Statistician.  Pregnancy and menstrual history. What immunizations do I need?  Influenza (flu) vaccine  This is recommended every year. Tetanus, diphtheria, and pertussis (Tdap) vaccine  You may need a Td booster every 10 years. Varicella (chickenpox) vaccine  You may need this vaccine if you have not already been vaccinated. Zoster (shingles) vaccine  You may need this after age 34. Pneumococcal conjugate (PCV13) vaccine  One dose is recommended after age 39. Pneumococcal polysaccharide (PPSV23) vaccine  One dose is recommended after age 18. Measles, mumps, and rubella (MMR) vaccine  You may need at least one dose of MMR if you were born in 1957  or later. You may also need a second dose. Meningococcal conjugate (MenACWY) vaccine  You may need this if you have certain conditions. Hepatitis A vaccine  You may need this if you have certain conditions or if you travel or work in places where you may be exposed to hepatitis A. Hepatitis B vaccine  You may need this if you have certain conditions or if you travel or work in places where you may be exposed to hepatitis B. Haemophilus influenzae type b (Hib) vaccine  You may need this if you have certain conditions. You may receive vaccines as individual doses or as more than one vaccine together in one shot (combination vaccines). Talk with your health care provider about the risks and benefits of combination vaccines. What tests do I need? Blood tests  Lipid and cholesterol levels. These may be checked every 5 years, or more frequently depending on your overall health.  Hepatitis C test.  Hepatitis B test. Screening  Lung cancer screening. You may have this screening every year starting at age 93 if you have a 30-pack-year history of smoking and currently smoke or have quit within the past 15 years.  Colorectal cancer screening. All adults should have this screening starting at age 76 and continuing until age 64. Your health care provider may recommend screening at age 78 if you are at increased risk. You will have tests every 1-10 years, depending on your results and the type of screening test.  Diabetes screening. This is done by checking your blood sugar (glucose) after you have not eaten for a while (fasting). You may have this  done every 1-3 years.  Mammogram. This may be done every 1-2 years. Talk with your health care provider about how often you should have regular mammograms.  BRCA-related cancer screening. This may be done if you have a family history of breast, ovarian, tubal, or peritoneal cancers. Other tests  Sexually transmitted disease (STD) testing.  Bone  density scan. This is done to screen for osteoporosis. You may have this done starting at age 31. Follow these instructions at home: Eating and drinking  Eat a diet that includes fresh fruits and vegetables, whole grains, lean protein, and low-fat dairy products. Limit your intake of foods with high amounts of sugar, saturated fats, and salt.  Take vitamin and mineral supplements as recommended by your health care provider.  Do not drink alcohol if your health care provider tells you not to drink.  If you drink alcohol: ? Limit how much you have to 0-1 drink a day. ? Be aware of how much alcohol is in your drink. In the [place] one drink equals one 12 oz bottle of beer (355 mL), one 5 oz glass of wine (148 mL), or one 1 oz glass of hard liquor (44 mL). Lifestyle  Take daily care of your teeth and gums.  Stay active. Exercise for at least 30 minutes on 5 or more days each week.  Do not use any products that contain nicotine or tobacco, such as cigarettes, e-cigarettes, and chewing tobacco. If you need help quitting, ask your health care provider.  If you are sexually active, practice safe sex. Use a condom or other form of protection in order to prevent STIs (sexually transmitted infections).  Talk with your health care provider about taking a low-dose aspirin or statin. What's next?  Go to your health care provider once a year for a well check visit.  Ask your health care provider how often you should have your eyes and teeth checked.  Stay up to date on all vaccines. This information is not intended to replace advice given to you by your health care provider. Make sure you discuss any questions you have with your health care provider. Document Released: 11/28/2015 Document Revised: 10/26/2018 Document Reviewed: 10/26/2018 Elsevier Patient Education  2020 [employer].

## 2019-10-08 NOTE — Assessment & Plan Note (Signed)
Compliant to calcium and vitamin D. Repeat bone density scan in 2021.

## 2019-10-08 NOTE — Assessment & Plan Note (Signed)
Immunizations UTD, will get flu shot next week. Colonoscopy UTD. No repeat due. Mammogram and Bone Density UTD. Commended her on a healthy diet and regular exercise. Exam today unremarkable. Labs reviewed. Follow up in 1 year for CPE.

## 2019-10-08 NOTE — Progress Notes (Signed)
Subjective:    Patient ID: Robyn House, female    DOB: [birth year], 68 y.o.   MRN: [id number]  HPI  Robyn House is a 68 year old female who presents today for complete physical.  Immunizations: -Tetanus: Completed in 2016 -Influenza: Declines today, will get after Thanksgiving  -Shingles: Completed Shingrix in 2019 and 2020 -Pneumonia: Completed Prevnar in 2017, pneumovax in 2018  Diet: She endorses a healthy diet. Mostly eating home cooked meals.  Exercise: She is walking daily, also doing some yoga  Eye exam: Completed in 2020 Dental exam: Completes semi-annually   Mammogram: Completed in 2020 Dexa: Completed in 2019. Taking vitamin D. Colonoscopy: Completed in 2020, no repeat needed Hep C Screen: Negative   BP Readings from Last 3 Encounters:  10/08/19 122/72  11/27/18 (!) 142/72  09/27/18 120/74     Review of Systems  Constitutional: Negative for unexpected weight change.  HENT: Negative for rhinorrhea.   Respiratory: Negative for cough and shortness of breath.   Cardiovascular: Negative for chest pain.  Gastrointestinal: Negative for constipation and diarrhea.  Genitourinary: Negative for difficulty urinating.  Musculoskeletal: Positive for arthralgias.       Chronic foot pain  Skin: Negative for rash.  Allergic/Immunologic: Negative for environmental allergies.  Neurological: Negative for dizziness, numbness and headaches.  Psychiatric/Behavioral: The patient is not nervous/anxious.        Past Medical History:  Diagnosis Date  . Anemia    distant past  . Anorexia    college  . Cervicalgia    Had MRI, seeing neurosurgeon 09/22/15  . Community acquired pneumonia 11/28/2018  . Depression   . Hammertoe    LEFT 2ND  . Stiff neck    limited turn to left     Social History   Socioeconomic History  . Marital status: Married    Spouse name: Not on file  . Number of children: Not on file  . Years of education: Not on file  . Highest education  level: Not on file  Occupational History  . Not on file  Social Needs  . Financial resource strain: Not hard at all  . Food insecurity    Worry: Never true    Inability: Never true  . Transportation needs    Medical: No    Non-medical: No  Tobacco Use  . Smoking status: Never Smoker  . Smokeless tobacco: Never Used  Substance and Sexual Activity  . Alcohol use: Yes    Alcohol/week: 7.0 standard drinks    Types: 7 Glasses of wine per week  . Drug use: No  . Sexual activity: Not on file  Lifestyle  . Physical activity    Days per week: 2 days    Minutes per session: 60 min  . Stress: Not at all  Relationships  . Social Herbalist on phone: Not on file    Gets together: Not on file    Attends religious service: Not on file    Active member of club or organization: Not on file    Attends meetings of clubs or organizations: Not on file    Relationship status: Not on file  . Intimate partner violence    Fear of current or ex partner: No    Emotionally abused: No    Physically abused: No    Forced sexual activity: No  Other Topics Concern  . Not on file  Social History Narrative   Lives in [place] with husband.  Has 3 children and 1 step son, 70, 60, 74, 44. 1 son in [place], step son in [place]. 2 sons in [place].    No pets.   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     Past Surgical History:  Procedure Laterality Date  . CORRECTION OVERLAPPING TOES Left 09/03/2015   Procedure: CORRECTION OVERLAPPING TOE LEFT SECOND TOE;  Surgeon: Samara Deist, DPM;  Location: [facility];  Service: Podiatry;  Laterality: Left;  . HALLUX VALGUS LAPIDUS Left 09/03/2015   Procedure: HALLUX VALGUS LAPIDUS LEFT FOOT;  Surgeon: Samara Deist, DPM;  Location: [facility];  Service: Podiatry;  Laterality: Left;  WITH POPLITEAL 2ND CASE  PER SCHEDULING SHEET  . HAMMER TOE SURGERY Left 09/03/2015   Procedure: HAMMER TOE CORRECTION LEFT SECOND TOE;  Surgeon: Samara Deist, DPM;  Location: [facility];  Service: Podiatry;  Laterality: Left;  . INNER EAR SURGERY     Right ear drum collapsed, Dr. Tami Ribas  . SHOULDER SURGERY Right    Rotator cuff repair  . TONSILECTOMY/ADENOIDECTOMY WITH MYRINGOTOMY      Family History  Problem Relation Age of Onset  . Stroke Mother   . Heart disease Mother        arrhythmia  . Stroke Father   . Arthritis Maternal Grandmother   . Heart disease Maternal Grandfather   . Breast cancer Other 39    No Known Allergies  Current Outpatient Medications on File Prior to Visit  Medication Sig Dispense Refill  . Cholecalciferol (VITAMIN D-3 PO) Take by mouth.    . gabapentin (NEURONTIN) 100 MG capsule gabapentin 100 mg capsule  TAKE 1 CAPSULE BY MOUTH EVERY MORNING AND 2 CAPSULES AT BEDTIME    . MULTIPLE VITAMINS PO Take by mouth.     No current facility-administered medications on file prior to visit.     BP 122/72   Pulse (!) 54   Temp (!) 96.6 F (35.9 C) (Temporal)   Ht 5\' 3"  (1.6 m)   Wt 115 lb 4 oz (52.3 kg)   SpO2 100%   BMI 20.42 kg/m    Objective:   Physical Exam  Constitutional: She is oriented to person, place, and time. She appears well-nourished.  HENT:  Right Ear: Tympanic membrane and ear canal normal.  Left Ear: Tympanic membrane and ear canal normal.  Mouth/Throat: Oropharynx is clear and moist.  Eyes: Pupils are equal, round, and reactive to light. EOM are normal.  Neck: Neck supple.  Cardiovascular: Normal rate and regular rhythm.  Respiratory: Effort normal and breath sounds normal.  GI: Soft. Bowel sounds are normal. There is no abdominal tenderness.  Musculoskeletal: Normal range of motion.  Neurological: She is alert and oriented to person, place, and time. No cranial nerve deficit.  Reflex Scores:      Patellar reflexes are 2+ on the right side  and 2+ on the left side. Skin: Skin is warm and dry.  Psychiatric: She has a normal mood and affect.           Assessment & Plan:

## 2019-10-08 NOTE — Assessment & Plan Note (Signed)
Chronic. Overall doing well on gabapentin, continue same.

## 2019-10-18 ENCOUNTER — Ambulatory Visit: Payer: Medicare HMO

## 2019-10-25 ENCOUNTER — Other Ambulatory Visit: Payer: Self-pay

## 2019-10-25 ENCOUNTER — Ambulatory Visit (INDEPENDENT_AMBULATORY_CARE_PROVIDER_SITE_OTHER): Payer: Medicare HMO

## 2019-10-25 DIAGNOSIS — Z23 Encounter for immunization: Secondary | ICD-10-CM

## 2019-11-13 DIAGNOSIS — Z823 Family history of stroke: Secondary | ICD-10-CM | POA: Diagnosis not present

## 2019-11-13 DIAGNOSIS — R32 Unspecified urinary incontinence: Secondary | ICD-10-CM | POA: Diagnosis not present

## 2019-11-13 DIAGNOSIS — Z791 Long term (current) use of non-steroidal anti-inflammatories (NSAID): Secondary | ICD-10-CM | POA: Diagnosis not present

## 2019-11-13 DIAGNOSIS — Z7722 Contact with and (suspected) exposure to environmental tobacco smoke (acute) (chronic): Secondary | ICD-10-CM | POA: Diagnosis not present

## 2019-11-13 DIAGNOSIS — G8929 Other chronic pain: Secondary | ICD-10-CM | POA: Diagnosis not present

## 2019-11-13 DIAGNOSIS — G629 Polyneuropathy, unspecified: Secondary | ICD-10-CM | POA: Diagnosis not present

## 2019-12-26 ENCOUNTER — Other Ambulatory Visit: Payer: Self-pay | Admitting: Primary Care

## 2019-12-26 DIAGNOSIS — Z1231 Encounter for screening mammogram for malignant neoplasm of breast: Secondary | ICD-10-CM

## 2019-12-28 ENCOUNTER — Ambulatory Visit: Payer: Medicare HMO

## 2020-01-17 DIAGNOSIS — L538 Other specified erythematous conditions: Secondary | ICD-10-CM | POA: Diagnosis not present

## 2020-01-17 DIAGNOSIS — L820 Inflamed seborrheic keratosis: Secondary | ICD-10-CM | POA: Diagnosis not present

## 2020-02-13 ENCOUNTER — Ambulatory Visit
Admission: RE | Admit: 2020-02-13 | Discharge: 2020-02-13 | Disposition: A | Discharge status: Home / Self Care | Payer: Medicare HMO | Source: Ambulatory Visit | Attending: Primary Care | Admitting: Primary Care

## 2020-02-13 DIAGNOSIS — Z1231 Encounter for screening mammogram for malignant neoplasm of breast: Secondary | ICD-10-CM | POA: Diagnosis not present

## 2020-03-19 DIAGNOSIS — R69 Illness, unspecified: Secondary | ICD-10-CM | POA: Diagnosis not present

## 2020-06-23 ENCOUNTER — Telehealth (INDEPENDENT_AMBULATORY_CARE_PROVIDER_SITE_OTHER): Payer: Medicare HMO | Admitting: Internal Medicine

## 2020-06-23 ENCOUNTER — Encounter: Payer: Self-pay | Admitting: Internal Medicine

## 2020-06-23 DIAGNOSIS — J329 Chronic sinusitis, unspecified: Secondary | ICD-10-CM

## 2020-06-23 DIAGNOSIS — J301 Allergic rhinitis due to pollen: Secondary | ICD-10-CM | POA: Diagnosis not present

## 2020-06-23 DIAGNOSIS — B9789 Other viral agents as the cause of diseases classified elsewhere: Secondary | ICD-10-CM

## 2020-06-23 NOTE — Progress Notes (Signed)
Robyn House this afternoon right hip pain left knee pain left ankle pain Covid antibody testing in a 15-minute slot virtual Visit via Video Note  I connected with Robyn House on 06/23/20 at  9:15 AM EDT by a video enabled telemedicine application and verified that I am speaking with the correct person using two identifiers.  Location: Patient: Home Provider: Office   I discussed the limitations of evaluation and management by telemedicine and the availability of in person appointments. The patient expressed understanding and agreed to proceed.  HPI  Pt reports sneezing, nasal congestion, ear fullness and sore throat. This started 3 days ago. She is blowing clear/green mixed with blood out of her nose, mostly on the right side. She denies ear pain, drainage or loss of hearing.  She is having some difficulty swallowing, but has not noticed any white spots on the back of her throat. She denies headache, runny nose, loss of taste/smell, cough or SOB. She denies fever, chills or body aches. She has had sick contacts diagnosed with a sinus infection. She has not had sick contacts or exposure to Covid that she is aware of. She has had her Covid vaccine.  Review of Systems     Past Medical History:  Diagnosis Date  . Anemia    distant past  . Anorexia    college  . Cervicalgia    Had MRI, seeing neurosurgeon 09/22/15  . Community acquired pneumonia 11/28/2018  . Depression   . Hammertoe    LEFT 2ND  . Stiff neck    limited turn to left    Family History  Problem Relation Age of Onset  . Stroke Mother   . Heart disease Mother        arrhythmia  . Stroke Father   . Arthritis Maternal Grandmother   . Heart disease Maternal Grandfather   . Breast cancer Other 69    Social History   Socioeconomic History  . Marital status: Married    Spouse name: Not on file  . Number of children: Not on file  . Years of education: Not on file  . Highest education level: Not on file   Occupational History  . Not on file  Tobacco Use  . Smoking status: Never Smoker  . Smokeless tobacco: Never Used  Substance and Sexual Activity  . Alcohol use: Yes    Alcohol/week: 7.0 standard drinks    Types: 7 Glasses of wine per week  . Drug use: No  . Sexual activity: Not on file  Other Topics Concern  . Not on file  Social History Narrative   Lives in [place] with husband.    Has 3 children and 1 step son, 42, 7, 5, 68. 1 son in [place], step son in [place]. 2 sons in [place].    No pets.   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    Social Determinants of Health   Financial Resource Strain: Low Risk   . Difficulty of Paying Living Expenses: Not hard at all  Food Insecurity: No Food Insecurity  . Worried About Charity fundraiser in the Last Year: Never true  . Ran Out of Food in the Last Year: Never true  Transportation Needs: No Transportation Needs  . Lack of Transportation (Medical): No  . Lack of Transportation (Non-Medical): No  Physical Activity: Insufficiently  Active  . Days of Exercise per Week: 2 days  . Minutes of Exercise per Session: 60 min  Stress: No Stress Concern Present  . Feeling of Stress : Not at all  Social Connections:   . Frequency of Communication with Friends and Family:   . Frequency of Social Gatherings with Friends and Family:   . Attends Religious Services:   . Active Member of Clubs or Organizations:   . Attends Archivist Meetings:   Robyn Kitchen Marital Status:   Intimate Partner Violence: Not At Risk  . Fear of Current or Ex-Partner: No  . Emotionally Abused: No  . Physically Abused: No  . Sexually Abused: No    No Known Allergies   Constitutional: Denies headache, fatigue, fever,  abrupt weight changes.  HEENT:  Positive ear fullness, nasal congestion and sore throat. Denies eye redness, ear  pain, ringing in the ears, wax buildup, runny nose or bloody nose. Respiratory:   Denies cough, difficulty breathing or shortness of breath.  Cardiovascular: Denies chest pain, chest tightness, palpitations or swelling in the hands or feet.   No other specific complaints in a complete review of systems (except as listed in HPI above).  Objective:    General: Appears her stated age, in NAD. HEENT: Head: normal shape and size; Nose: no congestion noted; Throat/Mouth: no hoarseness noted.  Pulmonary/Chest: Normal effort. No respiratory distress. No wheezes, rales or ronchi noted.       Assessment & Plan:   Allergic Rhinitis/Viral Sinusitis:  Can use a Neti Pot which can be purchased from your local drug store. Flonase 2 sprays each nostril for 5 days and then daily as needed. Start Zytrec 10 mg daily x 1 week If no improvement by Friday, consider Augmentin [id number] 1 tab PO BID x 10 days Would have her consider OTC Covid testing if symptoms worse, on Thursday  RTC as needed or if symptoms persist. Webb Silversmith, NP    Follow Up Instructions:    I discussed the assessment and treatment plan with the patient. The patient was provided an opportunity to ask questions and all were answered. The patient agreed with the plan and demonstrated an understanding of the instructions.   The patient was advised to call back or seek an in-person evaluation if the symptoms worsen or if the condition fails to improve as anticipated.     Webb Silversmith, NP

## 2020-06-23 NOTE — Patient Instructions (Signed)
Allergic Rhinitis, Adult Allergic rhinitis is a reaction to allergens in the air. Allergens are tiny specks (particles) in the air that cause your body to have an allergic reaction. This condition cannot be passed from person to person (is not contagious). Allergic rhinitis cannot be cured, but it can be controlled. There are two types of allergic rhinitis:  Seasonal. This type is also called hay fever. It happens only during certain times of the year.  Perennial. This type can happen at any time of the year. What are the causes? This condition may be caused by:  Pollen from grasses, trees, and weeds.  House dust mites.  Pet dander.  Mold. What are the signs or symptoms? Symptoms of this condition include:  Sneezing.  Runny or stuffy nose (nasal congestion).  A lot of mucus in the back of the throat (postnasal drip).  Itchy nose.  Tearing of the eyes.  Trouble sleeping.  Being sleepy during day. How is this treated? There is no cure for this condition. You should avoid things that trigger your symptoms (allergens). Treatment can help to relieve symptoms. This may include:  Medicines that block allergy symptoms, such as antihistamines. These may be given as a shot, nasal spray, or pill.  Shots that are given until your body becomes less sensitive to the allergen (desensitization).  Stronger medicines, if all other treatments have not worked. Follow these instructions at home: Avoiding allergens   Find out what you are allergic to. Common allergens include smoke, dust, and pollen.  Avoid them if you can. These are some of the things that you can do to avoid allergens: ? Replace carpet with wood, tile, or vinyl flooring. Carpet can trap dander and dust. ? Clean any mold found in the home. ? Do not smoke. Do not allow smoking in your home. ? Change your heating and air conditioning filter at least once a month. ? During allergy season:  Keep windows closed as much as  you can. If possible, use air conditioning when there is a lot of pollen in the air.  Use a special filter for allergies with your furnace and air conditioner.  Plan outdoor activities when pollen counts are lowest. This is usually during the early morning or evening hours.  If you do go outdoors when pollen count is high, wear a special mask for people with allergies.  When you come indoors, take a shower and change your clothes before sitting on furniture or bedding. General instructions  Do not use fans in your home.  Do not hang clothes outside to dry.  Wear sunglasses to keep pollen out of your eyes.  Wash your hands right away after you touch household pets.  Take over-the-counter and prescription medicines only as told by your doctor.  Keep all follow-up visits as told by your doctor. This is important. Contact a doctor if:  You have a fever.  You have a cough that does not go away (is persistent).  You start to make whistling sounds when you breathe (wheeze).  Your symptoms do not get better with treatment.  You have thick fluid coming from your nose.  You start to have nosebleeds. Get help right away if:  Your tongue or your lips are swollen.  You have trouble breathing.  You feel dizzy or you feel like you are going to pass out (faint).  You have cold sweats. Summary  Allergic rhinitis is a reaction to allergens in the air.  This condition may be   caused by allergens. These include pollen, dust mites, pet dander, and mold.  Symptoms include a runny, itchy nose, sneezing, or tearing eyes. You may also have trouble sleeping or feel sleepy during the day.  Treatment includes taking medicines and avoiding allergens. You may also get shots or take stronger medicines.  Get help if you have a fever or a cough that does not stop. Get help right away if you are short of breath. This information is not intended to replace advice given to you by your health care  provider. Make sure you discuss any questions you have with your health care provider. Document Revised: 02/20/2019 Document Reviewed: 05/23/2018 Elsevier Patient Education  2020 [employer] Inc[employer]  

## 2020-07-17 DIAGNOSIS — D2272 Melanocytic nevi of left lower limb, including hip: Secondary | ICD-10-CM | POA: Diagnosis not present

## 2020-07-17 DIAGNOSIS — D2261 Melanocytic nevi of right upper limb, including shoulder: Secondary | ICD-10-CM | POA: Diagnosis not present

## 2020-07-17 DIAGNOSIS — L570 Actinic keratosis: Secondary | ICD-10-CM | POA: Diagnosis not present

## 2020-07-17 DIAGNOSIS — X32XXXA Exposure to sunlight, initial encounter: Secondary | ICD-10-CM | POA: Diagnosis not present

## 2020-07-17 DIAGNOSIS — L821 Other seborrheic keratosis: Secondary | ICD-10-CM | POA: Diagnosis not present

## 2020-07-17 DIAGNOSIS — D2262 Melanocytic nevi of left upper limb, including shoulder: Secondary | ICD-10-CM | POA: Diagnosis not present

## 2020-07-17 DIAGNOSIS — D2271 Melanocytic nevi of right lower limb, including hip: Secondary | ICD-10-CM | POA: Diagnosis not present

## 2020-07-17 DIAGNOSIS — D225 Melanocytic nevi of trunk: Secondary | ICD-10-CM | POA: Diagnosis not present

## 2020-09-02 ENCOUNTER — Telehealth: Payer: Self-pay | Admitting: Primary Care

## 2020-09-02 DIAGNOSIS — R69 Illness, unspecified: Secondary | ICD-10-CM | POA: Diagnosis not present

## 2020-09-02 NOTE — Telephone Encounter (Signed)
LVM for pt to rtn my call to r/s appt with NHA on 10/06/20

## 2020-10-05 ENCOUNTER — Other Ambulatory Visit: Payer: Self-pay | Admitting: Primary Care

## 2020-10-05 DIAGNOSIS — Z13220 Encounter for screening for lipoid disorders: Secondary | ICD-10-CM

## 2020-10-06 ENCOUNTER — Ambulatory Visit: Payer: Medicare HMO

## 2020-10-06 ENCOUNTER — Other Ambulatory Visit (INDEPENDENT_AMBULATORY_CARE_PROVIDER_SITE_OTHER): Payer: Medicare HMO

## 2020-10-06 ENCOUNTER — Other Ambulatory Visit: Payer: Self-pay

## 2020-10-06 DIAGNOSIS — Z13220 Encounter for screening for lipoid disorders: Secondary | ICD-10-CM

## 2020-10-06 LAB — LIPID PANEL
Cholesterol: 184 mg/dL (ref 0–200)
HDL: 77.8 mg/dL (ref >39.00)
LDL Cholesterol: 94 mg/dL (ref 0–99)
NonHDL: 105.82
Total CHOL/HDL Ratio: 2
Triglycerides: 61 mg/dL (ref 0.0–149.0)
VLDL: 12.2 mg/dL (ref 0.0–40.0)

## 2020-10-06 LAB — COMPREHENSIVE METABOLIC PANEL
ALT: 16 U/L (ref 0–35)
AST: 21 U/L (ref 0–37)
Albumin: 4.1 g/dL (ref 3.5–5.2)
Alkaline Phosphatase: 51 U/L (ref 39–117)
BUN: 17 mg/dL (ref 6–23)
CO2: 28 mEq/L (ref 19–32)
Calcium: 9.4 mg/dL (ref 8.4–10.5)
Chloride: 103 mEq/L (ref 96–112)
Creatinine, Ser: 0.77 mg/dL (ref 0.40–1.20)
GFR: 78.32 mL/min (ref >60.00)
Glucose, Bld: 80 mg/dL (ref 70–99)
Potassium: 4.1 mEq/L (ref 3.5–5.1)
Sodium: 137 mEq/L (ref 135–145)
Total Bilirubin: 0.5 mg/dL (ref 0.2–1.2)
Total Protein: 7.2 g/dL (ref 6.0–8.3)

## 2020-10-06 LAB — CBC
HCT: 38.1 % (ref 36.0–46.0)
Hemoglobin: 12.7 g/dL (ref 12.0–15.0)
MCHC: 33.4 g/dL (ref 30.0–36.0)
MCV: 93.5 fl (ref 78.0–100.0)
Platelets: 290 10*3/uL (ref 150.0–400.0)
RBC: 4.07 Mil/uL (ref 3.87–5.11)
RDW: 12.4 % (ref 11.5–15.5)
WBC: 4.7 10*3/uL (ref 4.0–10.5)

## 2020-10-13 ENCOUNTER — Ambulatory Visit (INDEPENDENT_AMBULATORY_CARE_PROVIDER_SITE_OTHER): Payer: Medicare HMO | Admitting: Primary Care

## 2020-10-13 ENCOUNTER — Encounter: Payer: Self-pay | Admitting: Primary Care

## 2020-10-13 ENCOUNTER — Other Ambulatory Visit: Payer: Self-pay

## 2020-10-13 VITALS — BP 124/60 | HR 57 | Temp 98.3°F | Ht 62.5 in | Wt 114.0 lb

## 2020-10-13 DIAGNOSIS — E2839 Other primary ovarian failure: Secondary | ICD-10-CM

## 2020-10-13 DIAGNOSIS — M79673 Pain in unspecified foot: Secondary | ICD-10-CM | POA: Diagnosis not present

## 2020-10-13 DIAGNOSIS — G8929 Other chronic pain: Secondary | ICD-10-CM

## 2020-10-13 DIAGNOSIS — M8580 Other specified disorders of bone density and structure, unspecified site: Secondary | ICD-10-CM | POA: Diagnosis not present

## 2020-10-13 DIAGNOSIS — Z0000 Encounter for general adult medical examination without abnormal findings: Secondary | ICD-10-CM

## 2020-10-13 DIAGNOSIS — Z1211 Encounter for screening for malignant neoplasm of colon: Secondary | ICD-10-CM

## 2020-10-13 NOTE — Assessment & Plan Note (Signed)
Chronic, doing well with gabapentin 100 mg for which is just taking 200 mg nightly. Continue current regimen.

## 2020-10-13 NOTE — Assessment & Plan Note (Signed)
Compliant to calcium and vitamin D, also is very active with weight bearing exercise. Repeat bone density scan due, she will wait to have it done in early 2022 with mammogram.

## 2020-10-13 NOTE — Patient Instructions (Signed)
Schedule your mammogram and bone density scan for next year.   Continue exercising. You should be getting 150 minutes of moderate intensity exercise weekly.  Continue to work on a healthy diet. Ensure you are consuming 64 ounces of water daily.  It was a pleasure to see you today!   Preventive Care 70 Years and Older, Female Preventive care refers to lifestyle choices and visits with your health care provider that can promote health and wellness. This includes:  A yearly physical exam. This is also called an annual well check.  Regular dental and eye exams.  Immunizations.  Screening for certain conditions.  Healthy lifestyle choices, such as diet and exercise. What can I expect for my preventive care visit? Physical exam Your health care provider will check:  Height and weight. These may be used to calculate body mass index (BMI), which is a measurement that tells if you are at a healthy weight.  Heart rate and blood pressure.  Your skin for abnormal spots. Counseling Your health care provider may ask you questions about:  Alcohol, tobacco, and drug use.  Emotional well-being.  Home and relationship well-being.  Sexual activity.  Eating habits.  History of falls.  Memory and ability to understand (cognition).  Work and work Statistician.  Pregnancy and menstrual history. What immunizations do I need?  Influenza (flu) vaccine  This is recommended every year. Tetanus, diphtheria, and pertussis (Tdap) vaccine  You may need a Td booster every 10 years. Varicella (chickenpox) vaccine  You may need this vaccine if you have not already been vaccinated. Zoster (shingles) vaccine  You may need this after age 70. Pneumococcal conjugate (PCV13) vaccine  One dose is recommended after age 70. Pneumococcal polysaccharide (PPSV23) vaccine  One dose is recommended after age 70. Measles, mumps, and rubella (MMR) vaccine  You may need at least one dose of MMR if  you were born in 1957 or later. You may also need a second dose. Meningococcal conjugate (MenACWY) vaccine  You may need this if you have certain conditions. Hepatitis A vaccine  You may need this if you have certain conditions or if you travel or work in places where you may be exposed to hepatitis A. Hepatitis B vaccine  You may need this if you have certain conditions or if you travel or work in places where you may be exposed to hepatitis B. Haemophilus influenzae type b (Hib) vaccine  You may need this if you have certain conditions. You may receive vaccines as individual doses or as more than one vaccine together in one shot (combination vaccines). Talk with your health care provider about the risks and benefits of combination vaccines. What tests do I need? Blood tests  Lipid and cholesterol levels. These may be checked every 5 years, or more frequently depending on your overall health.  Hepatitis C test.  Hepatitis B test. Screening  Lung cancer screening. You may have this screening every year starting at age 70 if you have a 30-pack-year history of smoking and currently smoke or have quit within the past 15 years.  Colorectal cancer screening. All adults should have this screening starting at age 70 and continuing until age 70. Your health care provider may recommend screening at age 78 if you are at increased risk. You will have tests every 1-10 years, depending on your results and the type of screening test.  Diabetes screening. This is done by checking your blood sugar (glucose) after you have not eaten for a while (  fasting). You may have this done every 1-3 years.  Mammogram. This may be done every 1-2 years. Talk with your health care provider about how often you should have regular mammograms.  BRCA-related cancer screening. This may be done if you have a family history of breast, ovarian, tubal, or peritoneal cancers. Other tests  Sexually transmitted disease (STD)  testing.  Bone density scan. This is done to screen for osteoporosis. You may have this done starting at age 70. Follow these instructions at home: Eating and drinking  Eat a diet that includes fresh fruits and vegetables, whole grains, lean protein, and low-fat dairy products. Limit your intake of foods with high amounts of sugar, saturated fats, and salt.  Take vitamin and mineral supplements as recommended by your health care provider.  Do not drink alcohol if your health care provider tells you not to drink.  If you drink alcohol: ? Limit how much you have to 0-1 drink a day. ? Be aware of how much alcohol is in your drink. In the [place] one drink equals one 12 oz bottle of beer (355 mL), one 5 oz glass of wine (148 mL), or one 1 oz glass of hard liquor (44 mL). Lifestyle  Take daily care of your teeth and gums.  Stay active. Exercise for at least 30 minutes on 5 or more days each week.  Do not use any products that contain nicotine or tobacco, such as cigarettes, e-cigarettes, and chewing tobacco. If you need help quitting, ask your health care provider.  If you are sexually active, practice safe sex. Use a condom or other form of protection in order to prevent STIs (sexually transmitted infections).  Talk with your health care provider about taking a low-dose aspirin or statin. What's next?  Go to your health care provider once a year for a well check visit.  Ask your health care provider how often you should have your eyes and teeth checked.  Stay up to date on all vaccines. This information is not intended to replace advice given to you by your health care provider. Make sure you discuss any questions you have with your health care provider. Document Revised: 10/26/2018 Document Reviewed: 10/26/2018 Elsevier Patient Education  2020 [employer].

## 2020-10-13 NOTE — Assessment & Plan Note (Signed)
Immunizations UTD. Mammogram and Bone density to be completed in early 2022.  Commended her on her active lifestyle, encouraged to continue.  Exam today unremarkable. Labs reviewed.

## 2020-10-13 NOTE — Progress Notes (Signed)
Subjective:    Patient ID: Robyn House, female    DOB: 1951/05/27, 69 y.o.   MRN: [id number]  HPI  This visit occurred during the SARS-CoV-2 public health emergency.  Safety protocols were in place, including screening questions prior to the visit, additional usage of staff PPE, and extensive cleaning of exam room while observing appropriate contact time as indicated for disinfecting solutions.   Robyn House is a 69 year old female who presents today for complete physical.  Immunizations: -Tetanus: Completed in 2016 -Influenza: Completed this season  -Shingles: Completed Shingrix -Pneumonia: Completed Prevnar in 2017, Pneumovax in 2018 -Covid-19: Completed series  Diet: She endorses a fair diet.  Exercise: She is exercising daily   Eye exam:  Completes annually  Dental exam: Completes semi-annually   Mammogram: Completed in March 2021 Dexa: Completed in 2019, osteopenia  Colonoscopy: Completed in 2020, does not need repeating.  Hep C Screen: Negative   BP Readings from Last 3 Encounters:  10/13/20 124/60  10/08/19 122/72  11/27/18 (!) 142/72     Review of Systems  Constitutional: Negative for unexpected weight change.  HENT: Negative for rhinorrhea.   Eyes: Negative for visual disturbance.  Respiratory: Negative for cough and shortness of breath.   Cardiovascular: Negative for chest pain.  Gastrointestinal: Negative for constipation and diarrhea.  Genitourinary: Negative for difficulty urinating.  Musculoskeletal: Positive for arthralgias.  Skin: Negative for rash.  Allergic/Immunologic: Positive for environmental allergies.  Neurological: Negative for dizziness and headaches.  Hematological: Negative for adenopathy.  Psychiatric/Behavioral: The patient is not nervous/anxious.        Past Medical History:  Diagnosis Date  . Anemia    distant past  . Anorexia    college  . Cervicalgia    Had MRI, seeing neurosurgeon 09/22/15  . Community acquired  pneumonia 11/28/2018  . Depression   . Hammertoe    LEFT 2ND  . Stiff neck    limited turn to left     Social History   Socioeconomic History  . Marital status: Married    Spouse name: Not on file  . Number of children: Not on file  . Years of education: Not on file  . Highest education level: Not on file  Occupational History  . Not on file  Tobacco Use  . Smoking status: Never Smoker  . Smokeless tobacco: Never Used  Substance and Sexual Activity  . Alcohol use: Yes    Alcohol/week: 7.0 standard drinks    Types: 7 Glasses of wine per week  . Drug use: No  . Sexual activity: Not on file  Other Topics Concern  . Not on file  Social History Narrative   Lives in [place] with husband.    Has 3 children and 1 step son, 26, 56, 91, 33. 1 son in [place], step son in [place]. 2 sons in [place].    No pets.   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    Social Determinants of Health   Financial Resource Strain:   . Difficulty of Paying Living Expenses: Not on file  Food Insecurity:   . Worried About Charity fundraiser in the Last Year: Not on file  . Ran Out of Food in the Last Year: Not on file  Transportation Needs:   . Lack of Transportation (Medical): Not on file  .  Lack of Transportation (Non-Medical): Not on file  Physical Activity:   . Days of Exercise per Week: Not on file  . Minutes of Exercise per Session: Not on file  Stress:   . Feeling of Stress : Not on file  Social Connections:   . Frequency of Communication with Friends and Family: Not on file  . Frequency of Social Gatherings with Friends and Family: Not on file  . Attends Religious Services: Not on file  . Active Member of Clubs or Organizations: Not on file  . Attends Archivist Meetings: Not on file  . Marital Status: Not on file  Intimate Partner Violence:   .  Fear of Current or Ex-Partner: Not on file  . Emotionally Abused: Not on file  . Physically Abused: Not on file  . Sexually Abused: Not on file    Past Surgical History:  Procedure Laterality Date  . CORRECTION OVERLAPPING TOES Left 09/03/2015   Procedure: CORRECTION OVERLAPPING TOE LEFT SECOND TOE;  Surgeon: Samara Deist, DPM;  Location: [facility];  Service: Podiatry;  Laterality: Left;  . HALLUX VALGUS LAPIDUS Left 09/03/2015   Procedure: HALLUX VALGUS LAPIDUS LEFT FOOT;  Surgeon: Samara Deist, DPM;  Location: [facility];  Service: Podiatry;  Laterality: Left;  WITH POPLITEAL 2ND CASE PER SCHEDULING SHEET  . HAMMER TOE SURGERY Left 09/03/2015   Procedure: HAMMER TOE CORRECTION LEFT SECOND TOE;  Surgeon: Samara Deist, DPM;  Location: [facility];  Service: Podiatry;  Laterality: Left;  . INNER EAR SURGERY     Right ear drum collapsed, Dr. Tami Ribas  . SHOULDER SURGERY Right    Rotator cuff repair  . TONSILECTOMY/ADENOIDECTOMY WITH MYRINGOTOMY      Family History  Problem Relation Age of Onset  . Stroke Mother   . Heart disease Mother        arrhythmia  . Stroke Father   . Arthritis Maternal Grandmother   . Heart disease Maternal Grandfather   . Breast cancer Other 39    No Known Allergies  Current Outpatient Medications on File Prior to Visit  Medication Sig Dispense Refill  . Cholecalciferol (VITAMIN D-3 PO) Take by mouth.    . gabapentin (NEURONTIN) 100 MG capsule Taking 2 at bedtime    . MULTIPLE VITAMINS PO Take by mouth.     No current facility-administered medications on file prior to visit.    BP 124/60   Pulse (!) 57   Temp 98.3 F (36.8 C) (Temporal)   Ht 5' 2.5" (1.588 m)   Wt 114 lb (51.7 kg)   SpO2 100%   BMI 20.52 kg/m    Objective:   Physical Exam HENT:     Right Ear: Tympanic membrane and ear canal normal.     Left Ear: Tympanic membrane and ear canal normal.  Eyes:     Pupils: Pupils are equal, round, and  reactive to light.  Cardiovascular:     Rate and Rhythm: Normal rate and regular rhythm.  Pulmonary:     Effort: Pulmonary effort is normal.     Breath sounds: Normal breath sounds.  Abdominal:     General: Bowel sounds are normal.     Palpations: Abdomen is soft.     Tenderness: There is no abdominal tenderness.  Musculoskeletal:        General: Normal range of motion.     Cervical back: Neck supple.  Skin:    General: Skin is warm and dry.  Neurological:  Mental Status: She is alert and oriented to person, place, and time.     Cranial Nerves: No cranial nerve deficit.     Deep Tendon Reflexes:     Reflex Scores:      Patellar reflexes are 2+ on the right side and 2+ on the left side. Psychiatric:        Mood and Affect: Mood normal.            Assessment & Plan:

## 2020-10-20 DIAGNOSIS — H524 Presbyopia: Secondary | ICD-10-CM | POA: Diagnosis not present

## 2020-10-28 ENCOUNTER — Other Ambulatory Visit: Payer: Self-pay

## 2020-10-28 ENCOUNTER — Ambulatory Visit: Payer: Medicare HMO

## 2020-10-28 ENCOUNTER — Telehealth: Payer: Self-pay

## 2020-10-28 NOTE — Telephone Encounter (Signed)
Called patient to complete AWV. Patient never answered the phone. Attempted 3 times. Unable to leave message because voicemail is full. Appointment cancelled.

## 2020-10-29 ENCOUNTER — Ambulatory Visit: Payer: Medicare HMO

## 2020-10-29 DIAGNOSIS — R69 Illness, unspecified: Secondary | ICD-10-CM | POA: Diagnosis not present

## 2021-01-09 DIAGNOSIS — M7061 Trochanteric bursitis, right hip: Secondary | ICD-10-CM | POA: Diagnosis not present

## 2021-01-09 DIAGNOSIS — M7062 Trochanteric bursitis, left hip: Secondary | ICD-10-CM | POA: Diagnosis not present

## 2021-01-14 ENCOUNTER — Telehealth: Payer: Self-pay | Admitting: Primary Care

## 2021-01-14 NOTE — Telephone Encounter (Signed)
LVM for pt to rtn my call to schedule awv with nha. 

## 2021-01-28 DIAGNOSIS — M8580 Other specified disorders of bone density and structure, unspecified site: Secondary | ICD-10-CM | POA: Diagnosis not present

## 2021-01-28 DIAGNOSIS — R32 Unspecified urinary incontinence: Secondary | ICD-10-CM | POA: Diagnosis not present

## 2021-01-28 DIAGNOSIS — Z823 Family history of stroke: Secondary | ICD-10-CM | POA: Diagnosis not present

## 2021-01-28 DIAGNOSIS — G629 Polyneuropathy, unspecified: Secondary | ICD-10-CM | POA: Diagnosis not present

## 2021-01-28 DIAGNOSIS — G8929 Other chronic pain: Secondary | ICD-10-CM | POA: Diagnosis not present

## 2021-01-28 DIAGNOSIS — Z8249 Family history of ischemic heart disease and other diseases of the circulatory system: Secondary | ICD-10-CM | POA: Diagnosis not present

## 2021-02-05 DIAGNOSIS — G8929 Other chronic pain: Secondary | ICD-10-CM

## 2021-02-05 MED ORDER — GABAPENTIN 100 MG PO CAPS: 200.0000 mg | ORAL_CAPSULE | Freq: Every day | ORAL | 2 refills | Status: DC

## 2021-03-03 ENCOUNTER — Ambulatory Visit
Admission: RE | Admit: 2021-03-03 | Discharge: 2021-03-03 | Disposition: A | Discharge status: Home / Self Care | Payer: Medicare HMO | Source: Ambulatory Visit | Attending: Primary Care | Admitting: Primary Care

## 2021-03-03 ENCOUNTER — Other Ambulatory Visit: Payer: Self-pay

## 2021-03-03 DIAGNOSIS — Z1231 Encounter for screening mammogram for malignant neoplasm of breast: Secondary | ICD-10-CM | POA: Insufficient documentation

## 2021-03-03 DIAGNOSIS — M8580 Other specified disorders of bone density and structure, unspecified site: Secondary | ICD-10-CM | POA: Diagnosis not present

## 2021-03-03 DIAGNOSIS — E2839 Other primary ovarian failure: Secondary | ICD-10-CM | POA: Insufficient documentation

## 2021-03-03 DIAGNOSIS — M8588 Other specified disorders of bone density and structure, other site: Secondary | ICD-10-CM | POA: Diagnosis not present

## 2021-03-03 DIAGNOSIS — Z1211 Encounter for screening for malignant neoplasm of colon: Secondary | ICD-10-CM

## 2021-03-13 ENCOUNTER — Ambulatory Visit: Payer: Medicare HMO

## 2021-06-05 NOTE — Telephone Encounter (Signed)
Noted  

## 2021-06-05 NOTE — Telephone Encounter (Signed)
Called patient she states symptoms started yesterday and only has very mild sore throat. No other symptoms. Reviewed all red words with her and she denies any. Informed that we do not have any virtual appointments today due to end of clinic.. I have given and repeated back to me the information on how to get a virtual visit with [facility]. Patient does not feel like at this time she will need any further treatment other than over the counter. If anything changes she will get appointment with . If any red word symptoms she will go to ED or UC for evaluation. Patient was able to have full conversation and answer all my questions. No noted distress at all during our phone call.

## 2021-07-23 DIAGNOSIS — D485 Neoplasm of uncertain behavior of skin: Secondary | ICD-10-CM | POA: Diagnosis not present

## 2021-07-23 DIAGNOSIS — C44519 Basal cell carcinoma of skin of other part of trunk: Secondary | ICD-10-CM | POA: Diagnosis not present

## 2021-07-23 DIAGNOSIS — D2262 Melanocytic nevi of left upper limb, including shoulder: Secondary | ICD-10-CM | POA: Diagnosis not present

## 2021-07-23 DIAGNOSIS — D225 Melanocytic nevi of trunk: Secondary | ICD-10-CM | POA: Diagnosis not present

## 2021-07-23 DIAGNOSIS — D2261 Melanocytic nevi of right upper limb, including shoulder: Secondary | ICD-10-CM | POA: Diagnosis not present

## 2021-07-23 DIAGNOSIS — D0472 Carcinoma in situ of skin of left lower limb, including hip: Secondary | ICD-10-CM | POA: Diagnosis not present

## 2021-07-23 DIAGNOSIS — D2272 Melanocytic nevi of left lower limb, including hip: Secondary | ICD-10-CM | POA: Diagnosis not present

## 2021-07-23 DIAGNOSIS — D2271 Melanocytic nevi of right lower limb, including hip: Secondary | ICD-10-CM | POA: Diagnosis not present

## 2021-08-06 DIAGNOSIS — S46912A Strain of unspecified muscle, fascia and tendon at shoulder and upper arm level, left arm, initial encounter: Secondary | ICD-10-CM | POA: Diagnosis not present

## 2021-08-06 DIAGNOSIS — M25512 Pain in left shoulder: Secondary | ICD-10-CM | POA: Diagnosis not present

## 2021-08-24 DIAGNOSIS — S46912A Strain of unspecified muscle, fascia and tendon at shoulder and upper arm level, left arm, initial encounter: Secondary | ICD-10-CM | POA: Diagnosis not present

## 2021-08-24 DIAGNOSIS — M25512 Pain in left shoulder: Secondary | ICD-10-CM | POA: Diagnosis not present

## 2021-09-22 DIAGNOSIS — S46912A Strain of unspecified muscle, fascia and tendon at shoulder and upper arm level, left arm, initial encounter: Secondary | ICD-10-CM | POA: Diagnosis not present

## 2021-09-23 DIAGNOSIS — S46912A Strain of unspecified muscle, fascia and tendon at shoulder and upper arm level, left arm, initial encounter: Secondary | ICD-10-CM | POA: Diagnosis not present

## 2021-09-23 DIAGNOSIS — M7522 Bicipital tendinitis, left shoulder: Secondary | ICD-10-CM | POA: Diagnosis not present

## 2021-09-23 DIAGNOSIS — R52 Pain, unspecified: Secondary | ICD-10-CM | POA: Diagnosis not present

## 2021-10-04 NOTE — Progress Notes (Signed)
Subjective:   Robyn House is a 70 y.o. female who presents for Medicare Annual (Subsequent) preventive examination.  I connected with Sharman Cheek today by telephone and verified that I am speaking with the correct person using two identifiers. Location patient: home Location provider: work Persons participating in the virtual visit: patient, Marine scientist.    I discussed the limitations, risks, security and privacy concerns of performing an evaluation and management service by telephone and the availability of in person appointments. I also discussed with the patient that there may be a patient responsible charge related to this service. The patient expressed understanding and verbally consented to this telephonic visit.    Interactive audio and video telecommunications were attempted between this provider and patient, however failed, due to patient having technical difficulties OR patient did not have access to video capability.  We continued and completed visit with audio only.  Some vital signs may be absent or patient reported.   Time Spent with patient on telephone encounter: 25 minutes  Review of Systems     Cardiac Risk Factors include: advanced age (>74men, >7 women)     Objective:    Today's Vitals   10/06/21 1115  Weight: 114 lb (51.7 kg)  Height: 5\' 2"  (1.575 m)   Body mass index is 20.85 kg/m.  Advanced Directives 10/06/2021 10/03/2019  Does Patient Have a Medical Advance Directive? Yes No  Type of Paramedic of [place];Living will -  Does patient want to make changes to medical advance directive? Yes (MAU/Ambulatory/Procedural Areas - Information given) -  Would patient like information on creating a medical advance directive? - Yes (MAU/Ambulatory/Procedural Areas - Information given)    Current Medications (verified) Outpatient Encounter Medications as of 10/06/2021  Medication Sig   Cholecalciferol (VITAMIN D-3 PO) Take by mouth.    meloxicam (MOBIC) 15 MG tablet Take 15 mg by mouth daily.   MULTIPLE VITAMINS PO Take by mouth.   gabapentin (NEURONTIN) 100 MG capsule Take 2 capsules (200 mg total) by mouth at bedtime. For pain. (Patient not taking: Reported on 10/06/2021)   No facility-administered encounter medications on file as of 10/06/2021.    Allergies (verified) Patient has no known allergies.   History: Past Medical History:  Diagnosis Date   Anemia    distant past   Anorexia    college   Cervicalgia    Had MRI, seeing neurosurgeon 09/22/15   Community acquired pneumonia 11/28/2018   Depression    Hammertoe    LEFT 2ND   Stiff neck    limited turn to left   Past Surgical History:  Procedure Laterality Date   CORRECTION OVERLAPPING TOES Left 09/03/2015   Procedure: CORRECTION OVERLAPPING TOE LEFT SECOND TOE;  Surgeon: Samara Deist, DPM;  Location: [facility];  Service: Podiatry;  Laterality: Left;   HALLUX VALGUS LAPIDUS Left 09/03/2015   Procedure: HALLUX VALGUS LAPIDUS LEFT FOOT;  Surgeon: Samara Deist, DPM;  Location: [facility];  Service: Podiatry;  Laterality: Left;  WITH POPLITEAL 2ND CASE PER SCHEDULING SHEET   HAMMER TOE SURGERY Left 09/03/2015   Procedure: HAMMER TOE CORRECTION LEFT SECOND TOE;  Surgeon: Samara Deist, DPM;  Location: [facility];  Service: Podiatry;  Laterality: Left;   INNER EAR SURGERY     Right ear drum collapsed, Dr. Tami Ribas   SHOULDER SURGERY Right    Rotator cuff repair   TONSILECTOMY/ADENOIDECTOMY WITH MYRINGOTOMY     Family History  Problem Relation Age of Onset  Stroke Mother    Heart disease Mother        arrhythmia   Stroke Father    Arthritis Maternal Grandmother    Heart disease Maternal Grandfather    Breast cancer Other 93   Social History   Socioeconomic History   Marital status: Married    Spouse name: Not on file   Number of children: Not on file   Years of education: Not on file   Highest education level: Not  on file  Occupational History   Not on file  Tobacco Use   Smoking status: Never   Smokeless tobacco: Never  Substance and Sexual Activity   Alcohol use: Yes    Alcohol/week: 7.0 standard drinks    Types: 7 Glasses of wine per week   Drug use: No   Sexual activity: Not on file  Other Topics Concern   Not on file  Social History Narrative   Lives in [place] with husband.    Has 3 children and 1 step son, 9, 23, 35, 67. 1 son in [place], step son in [place]. 2 sons in [place].    No pets.   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    Social Determinants of Health   Financial Resource Strain: Low Risk    Difficulty of Paying Living Expenses: Not hard at all  Food Insecurity: No Food Insecurity   Worried About Charity fundraiser in the Last Year: Never true   [place] in the Last Year: Never true  Transportation Needs: No Transportation Needs   Lack of Transportation (Medical): No   Lack of Transportation (Non-Medical): No  Physical Activity: Sufficiently Active   Days of Exercise per Week: 7 days   Minutes of Exercise per Session: 60 min  Stress: No Stress Concern Present   Feeling of Stress : Not at all  Social Connections: Socially Integrated   Frequency of Communication with Friends and Family: More than three times a week   Frequency of Social Gatherings with Friends and Family: More than three times a week   Attends Religious Services: More than 4 times per year   Active Member of [employer] or Organizations: Yes   Attends Music therapist: More than 4 times per year   Marital Status: Married    Tobacco Counseling Counseling given: Not Answered   Clinical Intake:  Pre-visit preparation completed: Yes  Pain : No/denies pain     BMI - recorded: 20.51 Nutritional Status: BMI of 19-24  Normal Nutritional Risks:  None Diabetes: No  How often do you need to have someone help you when you read instructions, pamphlets, or other written materials from your doctor or pharmacy?: 1 - Never  Diabetic?None  Interpreter Needed?: No  Information entered by :: Orrin Brigham LPN   Activities of Daily Living In your present state of health, do you have any difficulty performing the following activities: 10/06/2021 10/13/2020  Hearing? Y Y  Comment decrease hearing in right ear ear surgery about 10 year ago right ear.  Vision? N N  Difficulty concentrating or making decisions? N N  Walking or climbing stairs? N N  Dressing or bathing? N N  Doing errands, shopping? N N  Preparing Food and eating ? N -  Using the Toilet? N -  In the past six months, have  you accidently leaked urine? N -  Do you have problems with loss of bowel control? N -  Managing your Medications? N -  Managing your Finances? N -  Housekeeping or managing your Housekeeping? N -  Some recent data might be hidden    Patient Care Team: Pleas Koch, NP as PCP - General (Internal Medicine)  Indicate any recent Medical Services you may have received from other than [facility] providers in the past year (date may be approximate).     Assessment:   This is a routine wellness examination for [place].  Hearing/Vision screen Hearing Screening - Comments:: Right ear decrease hearing Vision Screening - Comments:: Last exam 2021, plans on making an appointment, Dr. Gloriann Loan  Dietary issues and exercise activities discussed: Current Exercise Habits: Structured exercise class;Home exercise routine, Type of exercise: yoga;walking;Other - see comments (cardio and pickle ball), Time (Minutes): 60, Frequency (Times/Week): 7, Weekly Exercise (Minutes/Week): 420, Intensity: Intense   Goals Addressed             This Visit's Progress    Patient Stated       Would lie to drink more water and maintain current health.       Depression  Screen PHQ 2/9 Scores 10/06/2021 10/13/2020 10/03/2019 07/21/2017 07/15/2016  PHQ - 2 Score 0 0 0 0 0  PHQ- 9 Score - - 0 - -    Fall Risk Fall Risk  10/06/2021 10/13/2020 10/03/2019 07/21/2017  Falls in the past year? 0 0 0 No  Number falls in past yr: 0 0 0 -  Injury with Fall? 0 0 0 -  Risk for fall due to : No Fall Risks - - -  Follow up Falls prevention discussed - Falls evaluation completed;Falls prevention discussed -    FALL RISK PREVENTION PERTAINING TO THE HOME:  Any stairs in or around the home? Yes  If so, are there any without handrails? No  Home free of loose throw rugs in walkways, pet beds, electrical cords, etc? Yes  Adequate lighting in your home to reduce risk of falls? Yes   ASSISTIVE DEVICES UTILIZED TO PREVENT FALLS:  Life alert? No  Use of a cane, walker or w/c? No  Grab bars in the bathroom? No  Shower chair or bench in shower? Yes  Elevated toilet seat or a handicapped toilet? No   TIMED UP AND GO:  Was the test performed? No , visit completed over the phone.   Cognitive Function: MMSE - Mini Mental State Exam 10/03/2019  Orientation to time 5  Orientation to Place 5  Registration 3  Attention/ Calculation 5  Recall 3  Language- repeat 1        Immunizations Immunization History  Administered Date(s) Administered   Influenza,inj,Quad PF,6+ Mos 10/25/2019   Influenza-Unspecified 09/08/2020, 09/04/2021   PFIZER(Purple Top)SARS-COV-2 Vaccination 12/11/2019, 01/01/2020   Pneumococcal Conjugate-13 07/15/2016   Pneumococcal Polysaccharide-23 07/21/2017   Tdap 07/11/2015   Zoster Recombinat (Shingrix) 09/27/2018, 12/27/2018    TDAP status: Up to date  Flu Vaccine status: Up to date  Pneumococcal vaccine status: Up to date  Covid-19 vaccine status: Information provided on how to obtain vaccines.   Qualifies for Shingles Vaccine? Yes   Zostavax completed No   Shingrix Completed?: Yes  Screening Tests Health Maintenance  Topic Date Due    COVID-19 Vaccine (3 - Booster for Pfizer series) 02/26/2020   MAMMOGRAM  03/04/2023   TETANUS/TDAP  07/10/2025   COLONOSCOPY (Pts 45-40yrs Insurance coverage will need  to be confirmed)  12/25/2028   Pneumonia Vaccine 18+ Years old  Completed   INFLUENZA VACCINE  Completed   DEXA SCAN  Completed   Hepatitis C Screening  Completed   Zoster Vaccines- Shingrix  Completed   HPV VACCINES  Aged Out    Health Maintenance  Health Maintenance Due  Topic Date Due   COVID-19 Vaccine (3 - Booster for Pfizer series) 02/26/2020    Colorectal cancer screening: Type of screening: Colonoscopy. Completed 12/25/18. Repeat every 10 years  Mammogram status: Completed 03/03/21. Repeat every year  Bone Density status: Completed 03/03/21. Results reflect: Bone density results: OSTEOPENIA. Repeat every 2 years. , Patient will have report sent to PCP  Lung Cancer Screening: (Low Dose CT Chest recommended if Age 49-80 years, 30 pack-year currently smoking OR have quit w/in 15years.) does not qualify.     Additional Screening:  Hepatitis C Screening: does qualify; Completed 07/08/16  Vision Screening: Recommended annual ophthalmology exams for early detection of glaucoma and other disorders of the eye. Is the patient up to date with their annual eye exam?  No, patient plans on scheduling an appointment Who is the provider or what is the name of the office in which the patient attends annual eye exams? Dr. Gloriann Loan   Dental Screening: Recommended annual dental exams for proper oral hygiene  Community Resource Referral / Chronic Care Management: CRR required this visit?  No   CCM required this visit?  No      Plan:     I have personally reviewed and noted the following in the patient's chart:   Medical and social history Use of alcohol, tobacco or illicit drugs  Current medications and supplements including opioid prescriptions.  Functional ability and status Nutritional status Physical  activity Advanced directives List of other physicians Hospitalizations, surgeries, and ER visits in previous 12 months Vitals Screenings to include cognitive, depression, and falls Referrals and appointments  In addition, I have reviewed and discussed with patient certain preventive protocols, quality metrics, and best practice recommendations. A written personalized care plan for preventive services as well as general preventive health recommendations were provided to patient.   Due to this being a telephonic visit, the after visit summary with patients personalized plan was offered to patient via mail or my-chart. Patient would like to access on my-chart.   Loma Messing, LPN   [id number]   Nurse Health Advisor  Nurse Notes: none

## 2021-10-05 DIAGNOSIS — M6281 Muscle weakness (generalized): Secondary | ICD-10-CM | POA: Diagnosis not present

## 2021-10-05 DIAGNOSIS — M25512 Pain in left shoulder: Secondary | ICD-10-CM | POA: Diagnosis not present

## 2021-10-05 DIAGNOSIS — M25612 Stiffness of left shoulder, not elsewhere classified: Secondary | ICD-10-CM | POA: Diagnosis not present

## 2021-10-06 ENCOUNTER — Other Ambulatory Visit: Payer: Medicare HMO

## 2021-10-06 ENCOUNTER — Ambulatory Visit (INDEPENDENT_AMBULATORY_CARE_PROVIDER_SITE_OTHER): Payer: Medicare HMO

## 2021-10-06 VITALS — Ht 62.0 in | Wt 114.0 lb

## 2021-10-06 DIAGNOSIS — Z0000 Encounter for general adult medical examination without abnormal findings: Secondary | ICD-10-CM

## 2021-10-06 NOTE — Patient Instructions (Signed)
Robyn House , Thank you for taking time to complete for your Medicare Wellness Visit. I appreciate your ongoing commitment to your health goals. Please review the following plan we discussed and let me know if I can assist you in the future.   Screening recommendations/referrals: Colonoscopy: up to date , completed 12/25/18, due 12/25/28 Mammogram: up to date, completed 03/03/21, due 03/03/22 Bone Density: up to date, completed 03/03/21, due 03/04/23 Recommended yearly ophthalmology/optometry visit for glaucoma screening and checkup Recommended yearly dental visit for hygiene and checkup  Vaccinations: Influenza vaccine: up to date Pneumococcal vaccine: up to date Tdap vaccine: up to date, completed 07/11/15, due 07/10/25 Shingles vaccine: up to date   Covid-19: Newest booster available at your local pharmacy   Advanced directives: Please bring a copy of Living Will and/or [place] for your chart.   Conditions/risks identified: see problem list  Next appointment: Follow up in one year for your annual wellness visit 10/11/22 @ 11:15am, this will be a telephone visit   Preventive Care 65 Years and Older, Female Preventive care refers to lifestyle choices and visits with your health care provider that can promote health and wellness. What does preventive care include? A yearly physical exam. This is also called an annual well check. Dental exams once or twice a year. Routine eye exams. Ask your health care provider how often you should have your eyes checked. Personal lifestyle choices, including: Daily care of your teeth and gums. Regular physical activity. Eating a healthy diet. Avoiding tobacco and drug use. Limiting alcohol use. Practicing safe sex. Taking low-dose aspirin every day. Taking vitamin and mineral supplements as recommended by your health care provider. What happens during an annual well check? The services and screenings done by your health care  provider during your annual well check will depend on your age, overall health, lifestyle risk factors, and family history of disease. Counseling  Your health care provider may ask you questions about your: Alcohol use. Tobacco use. Drug use. Emotional well-being. Home and relationship well-being. Sexual activity. Eating habits. History of falls. Memory and ability to understand (cognition). Work and work Statistician. Reproductive health. Screening  You may have the following tests or measurements: Height, weight, and BMI. Blood pressure. Lipid and cholesterol levels. These may be checked every 5 years, or more frequently if you are over 96 years old. Skin check. Lung cancer screening. You may have this screening every year starting at age 74 if you have a 30-pack-year history of smoking and currently smoke or have quit within the past 15 years. Fecal occult blood test (FOBT) of the stool. You may have this test every year starting at age 44. Flexible sigmoidoscopy or colonoscopy. You may have a sigmoidoscopy every 5 years or a colonoscopy every 10 years starting at age 30. Hepatitis C blood test. Hepatitis B blood test. Sexually transmitted disease (STD) testing. Diabetes screening. This is done by checking your blood sugar (glucose) after you have not eaten for a while (fasting). You may have this done every 1-3 years. Bone density scan. This is done to screen for osteoporosis. You may have this done starting at age 83. Mammogram. This may be done every 1-2 years. Talk to your health care provider about how often you should have regular mammograms. Talk with your health care provider about your test results, treatment options, and if necessary, the need for more tests. Vaccines  Your health care provider may recommend certain vaccines, such as: Influenza vaccine. This is  recommended every year. Tetanus, diphtheria, and acellular pertussis (Tdap, Td) vaccine. You may need a Td  booster every 10 years. Zoster vaccine. You may need this after age 38. Pneumococcal 13-valent conjugate (PCV13) vaccine. One dose is recommended after age 74. Pneumococcal polysaccharide (PPSV23) vaccine. One dose is recommended after age 39. Talk to your health care provider about which screenings and vaccines you need and how often you need them. This information is not intended to replace advice given to you by your health care provider. Make sure you discuss any questions you have with your health care provider. Document Released: 11/28/2015 Document Revised: 07/21/2016 Document Reviewed: 09/02/2015 Elsevier Interactive Patient Education  2017 [place] Prevention in the Home Falls can cause injuries. They can happen to people of all ages. There are many things you can do to make your home safe and to help prevent falls. What can I do on the outside of my home? Regularly fix the edges of walkways and driveways and fix any cracks. Remove anything that might make you trip as you walk through a door, such as a raised step or threshold. Trim any bushes or trees on the path to your home. Use bright outdoor lighting. Clear any walking paths of anything that might make someone trip, such as rocks or tools. Regularly check to see if handrails are loose or broken. Make sure that both sides of any steps have handrails. Any raised decks and porches should have guardrails on the edges. Have any leaves, snow, or ice cleared regularly. Use sand or salt on walking paths during winter. Clean up any spills in your garage right away. This includes oil or grease spills. What can I do in the bathroom? Use night lights. Install grab bars by the toilet and in the tub and shower. Do not use towel bars as grab bars. Use non-skid mats or decals in the tub or shower. If you need to sit down in the shower, use a plastic, non-slip stool. Keep the floor dry. Clean up any water that spills on the floor  as soon as it happens. Remove soap buildup in the tub or shower regularly. Attach bath mats securely with double-sided non-slip rug tape. Do not have throw rugs and other things on the floor that can make you trip. What can I do in the bedroom? Use night lights. Make sure that you have a light by your bed that is easy to reach. Do not use any sheets or blankets that are too big for your bed. They should not hang down onto the floor. Have a firm chair that has side arms. You can use this for support while you get dressed. Do not have throw rugs and other things on the floor that can make you trip. What can I do in the kitchen? Clean up any spills right away. Avoid walking on wet floors. Keep items that you use a lot in easy-to-reach places. If you need to reach something above you, use a strong step stool that has a grab bar. Keep electrical cords out of the way. Do not use floor polish or wax that makes floors slippery. If you must use wax, use non-skid floor wax. Do not have throw rugs and other things on the floor that can make you trip. What can I do with my stairs? Do not leave any items on the stairs. Make sure that there are handrails on both sides of the stairs and use them. Fix handrails that are  broken or loose. Make sure that handrails are as long as the stairways. Check any carpeting to make sure that it is firmly attached to the stairs. Fix any carpet that is loose or worn. Avoid having throw rugs at the top or bottom of the stairs. If you do have throw rugs, attach them to the floor with carpet tape. Make sure that you have a light switch at the top of the stairs and the bottom of the stairs. If you do not have them, ask someone to add them for you. What else can I do to help prevent falls? Wear shoes that: Do not have high heels. Have rubber bottoms. Are comfortable and fit you well. Are closed at the toe. Do not wear sandals. If you use a stepladder: Make sure that it is  fully opened. Do not climb a closed stepladder. Make sure that both sides of the stepladder are locked into place. Ask someone to hold it for you, if possible. Clearly mark and make sure that you can see: Any grab bars or handrails. First and last steps. Where the edge of each step is. Use tools that help you move around (mobility aids) if they are needed. These include: Canes. Walkers. Scooters. Crutches. Turn on the lights when you go into a dark area. Replace any light bulbs as soon as they burn out. Set up your furniture so you have a clear path. Avoid moving your furniture around. If any of your floors are uneven, fix them. If there are any pets around you, be aware of where they are. Review your medicines with your doctor. Some medicines can make you feel dizzy. This can increase your chance of falling. Ask your doctor what other things that you can do to help prevent falls. This information is not intended to replace advice given to you by your health care provider. Make sure you discuss any questions you have with your health care provider. Document Released: 08/28/2009 Document Revised: 04/08/2016 Document Reviewed: 12/06/2014 Elsevier Interactive Patient Education  2017 [employer].

## 2021-10-09 DIAGNOSIS — M6281 Muscle weakness (generalized): Secondary | ICD-10-CM | POA: Diagnosis not present

## 2021-10-09 DIAGNOSIS — M25512 Pain in left shoulder: Secondary | ICD-10-CM | POA: Diagnosis not present

## 2021-10-09 DIAGNOSIS — M25612 Stiffness of left shoulder, not elsewhere classified: Secondary | ICD-10-CM | POA: Diagnosis not present

## 2021-10-12 DIAGNOSIS — M6281 Muscle weakness (generalized): Secondary | ICD-10-CM | POA: Diagnosis not present

## 2021-10-12 DIAGNOSIS — M25612 Stiffness of left shoulder, not elsewhere classified: Secondary | ICD-10-CM | POA: Diagnosis not present

## 2021-10-12 DIAGNOSIS — M25512 Pain in left shoulder: Secondary | ICD-10-CM | POA: Diagnosis not present

## 2021-10-14 ENCOUNTER — Encounter: Payer: Medicare HMO | Admitting: Primary Care

## 2021-10-14 DIAGNOSIS — D0472 Carcinoma in situ of skin of left lower limb, including hip: Secondary | ICD-10-CM | POA: Diagnosis not present

## 2021-10-14 DIAGNOSIS — C44519 Basal cell carcinoma of skin of other part of trunk: Secondary | ICD-10-CM | POA: Diagnosis not present

## 2021-10-15 DIAGNOSIS — M25612 Stiffness of left shoulder, not elsewhere classified: Secondary | ICD-10-CM | POA: Diagnosis not present

## 2021-10-15 DIAGNOSIS — M25512 Pain in left shoulder: Secondary | ICD-10-CM | POA: Diagnosis not present

## 2021-10-15 DIAGNOSIS — M6281 Muscle weakness (generalized): Secondary | ICD-10-CM | POA: Diagnosis not present

## 2021-10-19 DIAGNOSIS — M6281 Muscle weakness (generalized): Secondary | ICD-10-CM | POA: Diagnosis not present

## 2021-10-19 DIAGNOSIS — M25612 Stiffness of left shoulder, not elsewhere classified: Secondary | ICD-10-CM | POA: Diagnosis not present

## 2021-10-19 DIAGNOSIS — M25512 Pain in left shoulder: Secondary | ICD-10-CM | POA: Diagnosis not present

## 2021-10-22 DIAGNOSIS — M25612 Stiffness of left shoulder, not elsewhere classified: Secondary | ICD-10-CM | POA: Diagnosis not present

## 2021-10-22 DIAGNOSIS — M6281 Muscle weakness (generalized): Secondary | ICD-10-CM | POA: Diagnosis not present

## 2021-10-22 DIAGNOSIS — M25512 Pain in left shoulder: Secondary | ICD-10-CM | POA: Diagnosis not present

## 2021-10-26 DIAGNOSIS — M25612 Stiffness of left shoulder, not elsewhere classified: Secondary | ICD-10-CM | POA: Diagnosis not present

## 2021-10-26 DIAGNOSIS — M6281 Muscle weakness (generalized): Secondary | ICD-10-CM | POA: Diagnosis not present

## 2021-10-26 DIAGNOSIS — M25512 Pain in left shoulder: Secondary | ICD-10-CM | POA: Diagnosis not present

## 2021-10-29 DIAGNOSIS — M25512 Pain in left shoulder: Secondary | ICD-10-CM | POA: Diagnosis not present

## 2021-10-29 DIAGNOSIS — M25612 Stiffness of left shoulder, not elsewhere classified: Secondary | ICD-10-CM | POA: Diagnosis not present

## 2021-10-29 DIAGNOSIS — M6281 Muscle weakness (generalized): Secondary | ICD-10-CM | POA: Diagnosis not present

## 2021-10-30 ENCOUNTER — Other Ambulatory Visit: Payer: Self-pay

## 2021-10-30 ENCOUNTER — Ambulatory Visit (INDEPENDENT_AMBULATORY_CARE_PROVIDER_SITE_OTHER): Payer: Medicare HMO | Admitting: Primary Care

## 2021-10-30 ENCOUNTER — Encounter: Payer: Self-pay | Admitting: Primary Care

## 2021-10-30 VITALS — BP 122/74 | HR 63 | Temp 97.7°F | Ht 62.0 in | Wt 114.0 lb

## 2021-10-30 DIAGNOSIS — M79673 Pain in unspecified foot: Secondary | ICD-10-CM

## 2021-10-30 DIAGNOSIS — E785 Hyperlipidemia, unspecified: Secondary | ICD-10-CM | POA: Diagnosis not present

## 2021-10-30 DIAGNOSIS — M8580 Other specified disorders of bone density and structure, unspecified site: Secondary | ICD-10-CM

## 2021-10-30 DIAGNOSIS — G8929 Other chronic pain: Secondary | ICD-10-CM

## 2021-10-30 DIAGNOSIS — M75102 Unspecified rotator cuff tear or rupture of left shoulder, not specified as traumatic: Secondary | ICD-10-CM

## 2021-10-30 DIAGNOSIS — Z0000 Encounter for general adult medical examination without abnormal findings: Secondary | ICD-10-CM

## 2021-10-30 HISTORY — DX: Unspecified rotator cuff tear or rupture of left shoulder, not specified as traumatic: M75.102

## 2021-10-30 LAB — COMPREHENSIVE METABOLIC PANEL
ALT: 19 U/L (ref 0–35)
AST: 22 U/L (ref 0–37)
Albumin: 4.2 g/dL (ref 3.5–5.2)
Alkaline Phosphatase: 53 U/L (ref 39–117)
BUN: 20 mg/dL (ref 6–23)
CO2: 26 mEq/L (ref 19–32)
Calcium: 10.2 mg/dL (ref 8.4–10.5)
Chloride: 102 mEq/L (ref 96–112)
Creatinine, Ser: 0.74 mg/dL (ref 0.40–1.20)
GFR: 81.53 mL/min (ref >60.00)
Glucose, Bld: 78 mg/dL (ref 70–99)
Potassium: 4.4 mEq/L (ref 3.5–5.1)
Sodium: 136 mEq/L (ref 135–145)
Total Bilirubin: 0.5 mg/dL (ref 0.2–1.2)
Total Protein: 7.5 g/dL (ref 6.0–8.3)

## 2021-10-30 LAB — LIPID PANEL
Cholesterol: 179 mg/dL (ref 0–200)
HDL: 70.8 mg/dL (ref >39.00)
LDL Cholesterol: 88 mg/dL (ref 0–99)
NonHDL: 108.37
Total CHOL/HDL Ratio: 3
Triglycerides: 103 mg/dL (ref 0.0–149.0)
VLDL: 20.6 mg/dL (ref 0.0–40.0)

## 2021-10-30 NOTE — Assessment & Plan Note (Signed)
No longer on gabapentin and doing well. Continue PRN meloxicam 15 mg.

## 2021-10-30 NOTE — Assessment & Plan Note (Signed)
Compliant to calcium and vitamin D. Bone density from 2022 reviewed, repeat in 2024.  Commended her on weight bearing exercise.

## 2021-10-30 NOTE — Assessment & Plan Note (Signed)
Immunizations UTD. Mammogram and bone density scan UTD. Colonoscopy in 2020, and does not need to ever complete again per patient.  Commended her on regular exercise and a healthy diet.  Exam today stable. Labs pending.

## 2021-10-30 NOTE — Assessment & Plan Note (Signed)
Following with orthopedics and physical therapy. Considering surgery in early 2023.  Continue Meloxicam 15 mg PRN.

## 2021-10-30 NOTE — Patient Instructions (Signed)
Stop by the lab prior to leaving today. I will notify you of your results once received.  ° °It was a pleasure to see you today! ° °Preventive Care 65 Years and Older, Female °Preventive care refers to lifestyle choices and visits with your health care provider that can promote health and wellness. Preventive care visits are also called wellness exams. °What can I expect for my preventive care visit? °Counseling °Your health care provider may ask you questions about your: °Medical history, including: °Past medical problems. °Family medical history. °Pregnancy and menstrual history. °History of falls. °Current health, including: °Memory and ability to understand (cognition). °Emotional well-being. °Home life and relationship well-being. °Sexual activity and sexual health. °Lifestyle, including: °Alcohol, nicotine or tobacco, and drug use. °Access to firearms. °Diet, exercise, and sleep habits. °Work and work environment. °Sunscreen use. °Safety issues such as seatbelt and bike helmet use. °Physical exam °Your health care provider will check your: °Height and weight. These may be used to calculate your BMI (body mass index). BMI is a measurement that tells if you are at a healthy weight. °Waist circumference. This measures the distance around your waistline. This measurement also tells if you are at a healthy weight and may help predict your risk of certain diseases, such as type 2 diabetes and high blood pressure. °Heart rate and blood pressure. °Body temperature. °Skin for abnormal spots. °What immunizations do I need? °Vaccines are usually given at various ages, according to a schedule. Your health care provider will recommend vaccines for you based on your age, medical history, and lifestyle or other factors, such as travel or where you work. °What tests do I need? °Screening °Your health care provider may recommend screening tests for certain conditions. This may include: °Lipid and cholesterol levels. °Hepatitis  C test. °Hepatitis B test. °HIV (human immunodeficiency virus) test. °STI (sexually transmitted infection) testing, if you are at risk. °Lung cancer screening. °Colorectal cancer screening. °Diabetes screening. This is done by checking your blood sugar (glucose) after you have not eaten for a while (fasting). °Mammogram. Talk with your health care provider about how often you should have regular mammograms. °BRCA-related cancer screening. This may be done if you have a family history of breast, ovarian, tubal, or peritoneal cancers. °Bone density scan. This is done to screen for osteoporosis. °Talk with your health care provider about your test results, treatment options, and if necessary, the need for more tests. °Follow these instructions at home: °Eating and drinking ° °Eat a diet that includes fresh fruits and vegetables, whole grains, lean protein, and low-fat dairy products. Limit your intake of foods with high amounts of sugar, saturated fats, and salt. °Take vitamin and mineral supplements as recommended by your health care provider. °Do not drink alcohol if your health care provider tells you not to drink. °If you drink alcohol: °Limit how much you have to 0-1 drink a day. °Know how much alcohol is in your drink. In the [place] one drink equals one 12 oz bottle of beer (355 mL), one 5 oz glass of wine (148 mL), or one 1½ oz glass of hard liquor (44 mL). °Lifestyle °Brush your teeth every morning and night with fluoride toothpaste. Floss one time each day. °Exercise for at least 30 minutes 5 or more days each week. °Do not use any products that contain nicotine or tobacco. These products include cigarettes, chewing tobacco, and vaping devices, such as e-cigarettes. If you need help quitting, ask your health care provider. °Do not use drugs. °  If you are sexually active, practice safe sex. Use a condom or other form of protection in order to prevent STIs. Take aspirin only as told by your health care provider.  Make sure that you understand how much to take and what form to take. Work with your health care provider to find out whether it is safe and beneficial for you to take aspirin daily. Ask your health care provider if you need to take a cholesterol-lowering medicine (statin). Find healthy ways to manage stress, such as: Meditation, yoga, or listening to music. Journaling. Talking to a trusted person. Spending time with friends and family. Minimize exposure to UV radiation to reduce your risk of skin cancer. Safety Always wear your seat belt while driving or riding in a vehicle. Do not drive: If you have been drinking alcohol. Do not ride with someone who has been drinking. When you are tired or distracted. While texting. If you have been using any mind-altering substances or drugs. Wear a helmet and other protective equipment during sports activities. If you have firearms in your house, make sure you follow all gun safety procedures. What's next? Visit your health care provider once a year for an annual wellness visit. Ask your health care provider how often you should have your eyes and teeth checked. Stay up to date on all vaccines. This information is not intended to replace advice given to you by your health care provider. Make sure you discuss any questions you have with your health care provider. Document Revised: 04/29/2021 Document Reviewed: 04/29/2021 Elsevier Patient Education  [place].

## 2021-10-30 NOTE — Progress Notes (Signed)
Subjective:    Patient ID: Robyn House, female    DOB: 1951/02/14, 70 y.o.   MRN: [id number]  HPI  Robyn House is a very pleasant 70 y.o. female who presents today for complete physical and follow up of chronic conditions.  Immunizations: -Tetanus: 2016 -Influenza: Completed this season  -Covid-19: 2 vaccines -Shingles: Shingrix completed -Pneumonia: Prevnar 13 in 2017, Pneumovax in 2018  Diet: healthy diet.  Exercise: regular exercise.  Eye exam: Completes annually  Dental exam: Completes semi-annually   Mammogram: Completed in April 2022 Dexa: Completed in April 2022, vitmain D.  Colonoscopy: Completed in 2020, no repeat needed per patient.   BP Readings from Last 3 Encounters:  10/30/21 122/74  10/13/20 124/60  10/08/19 122/72       Review of Systems  Constitutional:  Negative for unexpected weight change.  HENT:  Negative for rhinorrhea.   Respiratory:  Negative for cough and shortness of breath.   Cardiovascular:  Negative for chest pain.  Gastrointestinal:  Negative for constipation and diarrhea.  Genitourinary:  Negative for difficulty urinating.  Musculoskeletal:  Positive for arthralgias and myalgias.  Skin:  Negative for rash.  Allergic/Immunologic: Negative for environmental allergies.  Neurological:  Negative for dizziness and headaches.  Psychiatric/Behavioral:         Increased stress with her son's marital issues        Past Medical History:  Diagnosis Date   Anemia    distant past   Anorexia    college   Cervicalgia    Had MRI, seeing neurosurgeon 09/22/15   Community acquired pneumonia 11/28/2018   Depression    [place]    LEFT 2ND   Stiff neck    limited turn to left    Social History   Socioeconomic History   Marital status: Married    Spouse name: Not on file   Number of children: Not on file   Years of education: Not on file   Highest education level: Not on file  Occupational History   Not on file  Tobacco  Use   Smoking status: Never   Smokeless tobacco: Never  Substance and Sexual Activity   Alcohol use: Yes    Alcohol/week: 7.0 standard drinks    Types: 7 Glasses of wine per week   Drug use: No   Sexual activity: Not on file  Other Topics Concern   Not on file  Social History Narrative   Lives in [place] with husband.    Has 3 children and 1 step son, 25, 47, 66, 82. 1 son in [place], step son in [place]. 2 sons in [place].    No pets.   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    Social Determinants of Health   Financial Resource Strain: Low Risk    Difficulty of Paying Living Expenses: Not hard at all  Food Insecurity: No Food Insecurity   Worried About Charity fundraiser in the Last Year: Never true   [place] in the Last Year: Never true  Transportation Needs: No Transportation Needs   Lack of Transportation (Medical): No   Lack of Transportation (Non-Medical): No  Physical Activity: Sufficiently Active   Days of Exercise per Week: 7 days   Minutes of Exercise per Session: 60 min  Stress: No Stress Concern Present   Feeling  of Stress : Not at all  Social Connections: Socially Integrated   Frequency of Communication with Friends and Family: More than three times a week   Frequency of Social Gatherings with Friends and Family: More than three times a week   Attends Religious Services: More than 4 times per year   Active Member of Clubs or Organizations: Yes   Attends Music therapist: More than 4 times per year   Marital Status: Married  Human resources officer Violence: Not At Risk   Fear of Current or Ex-Partner: No   Emotionally Abused: No   Physically Abused: No   Sexually Abused: No    Past Surgical History:  Procedure Laterality Date   CORRECTION OVERLAPPING TOES Left 09/03/2015   Procedure: CORRECTION OVERLAPPING TOE  LEFT SECOND TOE;  Surgeon: Samara Deist, DPM;  Location: [facility];  Service: Podiatry;  Laterality: Left;   HALLUX VALGUS LAPIDUS Left 09/03/2015   Procedure: HALLUX VALGUS LAPIDUS LEFT FOOT;  Surgeon: Samara Deist, DPM;  Location: [facility];  Service: Podiatry;  Laterality: Left;  WITH POPLITEAL 2ND CASE PER SCHEDULING SHEET   HAMMER TOE SURGERY Left 09/03/2015   Procedure: HAMMER TOE CORRECTION LEFT SECOND TOE;  Surgeon: Samara Deist, DPM;  Location: [facility];  Service: Podiatry;  Laterality: Left;   INNER EAR SURGERY     Right ear drum collapsed, Dr. Tami Ribas   SHOULDER SURGERY Right    Rotator cuff repair   TONSILECTOMY/ADENOIDECTOMY WITH MYRINGOTOMY      Family History  Problem Relation Age of Onset   Stroke Mother    Heart disease Mother        arrhythmia   Stroke Father    Arthritis Maternal Grandmother    Heart disease Maternal Grandfather    Breast cancer Other 39    No Known Allergies  Current Outpatient Medications on File Prior to Visit  Medication Sig Dispense Refill   Cholecalciferol (VITAMIN D-3 PO) Take by mouth.     meloxicam (MOBIC) 15 MG tablet Take 15 mg by mouth daily.     MULTIPLE VITAMINS PO Take by mouth.     No current facility-administered medications on file prior to visit.    BP 122/74    Pulse 63    Temp 97.7 F (36.5 C) (Temporal)    Ht 5\' 2"  (1.575 m)    Wt 114 lb (51.7 kg)    SpO2 99%    BMI 20.85 kg/m  Objective:   Physical Exam HENT:     Right Ear: Tympanic membrane and ear canal normal.     Left Ear: Tympanic membrane and ear canal normal.     Nose: Nose normal.  Eyes:     Conjunctiva/sclera: Conjunctivae normal.     Pupils: Pupils are equal, round, and reactive to light.  Neck:     Thyroid: No thyromegaly.  Cardiovascular:     Rate and Rhythm: Normal rate and regular rhythm.     Heart sounds: No murmur heard. Pulmonary:     Effort: Pulmonary effort is normal.     Breath sounds: Normal breath  sounds. No rales.  Abdominal:     General: Bowel sounds are normal.     Palpations: Abdomen is soft.     Tenderness: There is no abdominal tenderness.  Musculoskeletal:        General: Normal range of motion.     Cervical back: Neck supple.  Lymphadenopathy:     Cervical: No cervical adenopathy.  Skin:  General: Skin is warm and dry.     Findings: No rash.  Neurological:     Mental Status: She is alert and oriented to person, place, and time.     Cranial Nerves: No cranial nerve deficit.     Deep Tendon Reflexes: Reflexes are normal and symmetric.  Psychiatric:        Mood and Affect: Mood normal.          Assessment & Plan:      This visit occurred during the SARS-CoV-2 public health emergency.  Safety protocols were in place, including screening questions prior to the visit, additional usage of staff PPE, and extensive cleaning of exam room while observing appropriate contact time as indicated for disinfecting solutions.

## 2021-10-30 NOTE — Assessment & Plan Note (Addendum)
Improved last year. Repeat lipid panel pending.  Not on treatment.

## 2021-11-02 DIAGNOSIS — M25612 Stiffness of left shoulder, not elsewhere classified: Secondary | ICD-10-CM | POA: Diagnosis not present

## 2021-11-02 DIAGNOSIS — M6281 Muscle weakness (generalized): Secondary | ICD-10-CM | POA: Diagnosis not present

## 2021-11-02 DIAGNOSIS — M25512 Pain in left shoulder: Secondary | ICD-10-CM | POA: Diagnosis not present

## 2021-11-11 DIAGNOSIS — M6281 Muscle weakness (generalized): Secondary | ICD-10-CM | POA: Diagnosis not present

## 2021-11-11 DIAGNOSIS — M25512 Pain in left shoulder: Secondary | ICD-10-CM | POA: Diagnosis not present

## 2021-11-11 DIAGNOSIS — M25612 Stiffness of left shoulder, not elsewhere classified: Secondary | ICD-10-CM | POA: Diagnosis not present

## 2021-11-18 DIAGNOSIS — M25612 Stiffness of left shoulder, not elsewhere classified: Secondary | ICD-10-CM | POA: Diagnosis not present

## 2021-11-18 DIAGNOSIS — S46912A Strain of unspecified muscle, fascia and tendon at shoulder and upper arm level, left arm, initial encounter: Secondary | ICD-10-CM | POA: Diagnosis not present

## 2021-11-18 DIAGNOSIS — M25512 Pain in left shoulder: Secondary | ICD-10-CM | POA: Diagnosis not present

## 2021-11-18 DIAGNOSIS — M6281 Muscle weakness (generalized): Secondary | ICD-10-CM | POA: Diagnosis not present

## 2021-11-24 DIAGNOSIS — M6281 Muscle weakness (generalized): Secondary | ICD-10-CM | POA: Diagnosis not present

## 2021-11-24 DIAGNOSIS — M25512 Pain in left shoulder: Secondary | ICD-10-CM | POA: Diagnosis not present

## 2021-11-24 DIAGNOSIS — M25612 Stiffness of left shoulder, not elsewhere classified: Secondary | ICD-10-CM | POA: Diagnosis not present

## 2021-12-10 ENCOUNTER — Ambulatory Visit (INDEPENDENT_AMBULATORY_CARE_PROVIDER_SITE_OTHER): Payer: Medicare HMO | Admitting: Primary Care

## 2021-12-10 ENCOUNTER — Other Ambulatory Visit: Payer: Self-pay

## 2021-12-10 VITALS — BP 130/78 | HR 76 | Temp 97.6°F | Ht 62.0 in | Wt 117.0 lb

## 2021-12-10 DIAGNOSIS — N898 Other specified noninflammatory disorders of vagina: Secondary | ICD-10-CM

## 2021-12-10 DIAGNOSIS — R3915 Urgency of urination: Secondary | ICD-10-CM

## 2021-12-10 DIAGNOSIS — R102 Pelvic and perineal pain: Secondary | ICD-10-CM | POA: Diagnosis not present

## 2021-12-10 HISTORY — DX: Other specified noninflammatory disorders of vagina: N89.8

## 2021-12-10 HISTORY — DX: Urgency of urination: R39.15

## 2021-12-10 LAB — POC URINALSYSI DIPSTICK (AUTOMATED)
Bilirubin, UA: NEGATIVE
Blood, UA: NEGATIVE
Glucose, UA: NEGATIVE
Ketones, UA: NEGATIVE
Leukocytes, UA: NEGATIVE
Nitrite, UA: NEGATIVE
Protein, UA: NEGATIVE
Spec Grav, UA: 1.02 (ref 1.010–1.025)
Urobilinogen, UA: 0.2 E.U./dL — AB
pH, UA: 6 (ref 5.0–8.0)

## 2021-12-10 NOTE — Progress Notes (Signed)
Subjective:    Patient ID: Robyn House, female    DOB: 06/12/1951, 71 y.o.   MRN: [id number]  HPI  Robyn House is a very pleasant 71 y.o. female with a history of UTI, hyperlipidemia, osteopenia who presents today to discuss pelvic cramping.  She also reports vaginal irritation with urination, urinary frequency, some urinary urgency (chronic). Symptoms began about 2-3 weeks ago.   She denies hematuria, vaginal discharge, flank pain.   She does notice vaginal dryness at times.   She not taken anything OTC.    She's noticed a sensation that something has descended in the vaginal canal, thinks this is her bladder. She does have chronic urgency that is intermittent.   BP Readings from Last 3 Encounters:  12/10/21 130/78  10/30/21 122/74  10/13/20 124/60        Review of Systems  Constitutional:  Negative for fever.  Gastrointestinal:  Negative for abdominal pain.  Genitourinary:  Positive for frequency, pelvic pain and urgency. Negative for dysuria, flank pain, hematuria, vaginal bleeding and vaginal discharge.        Past Medical History:  Diagnosis Date   Anemia    distant past   Anorexia    college   Cervicalgia    Had MRI, seeing neurosurgeon 09/22/15   Community acquired pneumonia 11/28/2018   Depression    Hammertoe    LEFT 2ND   Stiff neck    limited turn to left    Social History   Socioeconomic History   Marital status: Married    Spouse name: Not on file   Number of children: Not on file   Years of education: Not on file   Highest education level: Not on file  Occupational History   Not on file  Tobacco Use   Smoking status: Never   Smokeless tobacco: Never  Substance and Sexual Activity   Alcohol use: Yes    Alcohol/week: 7.0 standard drinks    Types: 7 Glasses of wine per week   Drug use: No   Sexual activity: Not on file  Other Topics Concern   Not on file  Social History Narrative   Lives in [place] with husband.    Has 3  children and 1 step son, 14, 77, 57, 96. 1 son in [place], step son in [place]. 2 sons in [place].    No pets.   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    Social Determinants of Health   Financial Resource Strain: Low Risk    Difficulty of Paying Living Expenses: Not hard at all  Food Insecurity: No Food Insecurity   Worried About Charity fundraiser in the Last Year: Never true   [place] in the Last Year: Never true  Transportation Needs: No Transportation Needs   Lack of Transportation (Medical): No   Lack of Transportation (Non-Medical): No  Physical Activity: Sufficiently Active   Days of Exercise per Week: 7 days   Minutes of Exercise per Session: 60 min  Stress: No Stress Concern Present   Feeling of Stress : Not at all  Social Connections: Socially Integrated   Frequency of Communication with Friends and Family: More than three times a week   Frequency of Social Gatherings with Friends and Family: More than three times a week   Attends Religious Services: More  than 4 times per year   Active Member of Clubs or Organizations: Yes   Attends Archivist Meetings: More than 4 times per year   Marital Status: Married  Human resources officer Violence: Not At Risk   Fear of Current or Ex-Partner: No   Emotionally Abused: No   Physically Abused: No   Sexually Abused: No    Past Surgical History:  Procedure Laterality Date   CORRECTION OVERLAPPING TOES Left 09/03/2015   Procedure: CORRECTION OVERLAPPING TOE LEFT SECOND TOE;  Surgeon: Samara Deist, DPM;  Location: [facility];  Service: Podiatry;  Laterality: Left;   HALLUX VALGUS LAPIDUS Left 09/03/2015   Procedure: HALLUX VALGUS LAPIDUS LEFT FOOT;  Surgeon: Samara Deist, DPM;  Location: [facility];  Service: Podiatry;  Laterality: Left;  WITH POPLITEAL 2ND CASE PER SCHEDULING  SHEET   HAMMER TOE SURGERY Left 09/03/2015   Procedure: HAMMER TOE CORRECTION LEFT SECOND TOE;  Surgeon: Samara Deist, DPM;  Location: [facility];  Service: Podiatry;  Laterality: Left;   INNER EAR SURGERY     Right ear drum collapsed, Dr. Tami Ribas   SHOULDER SURGERY Right    Rotator cuff repair   TONSILECTOMY/ADENOIDECTOMY WITH MYRINGOTOMY      Family History  Problem Relation Age of Onset   Stroke Mother    Heart disease Mother        arrhythmia   Stroke Father    Arthritis Maternal Grandmother    Heart disease Maternal Grandfather    Breast cancer Other 39    No Known Allergies  Current Outpatient Medications on File Prior to Visit  Medication Sig Dispense Refill   Cholecalciferol (VITAMIN D-3 PO) Take by mouth.     meloxicam (MOBIC) 15 MG tablet Take 15 mg by mouth daily.     MULTIPLE VITAMINS PO Take by mouth.     No current facility-administered medications on file prior to visit.    BP 130/78    Pulse 76    Temp 97.6 F (36.4 C) (Temporal)    Ht 5\' 2"  (1.575 m)    Wt 117 lb (53.1 kg)    SpO2 98%    BMI 21.40 kg/m  Objective:   Physical Exam Cardiovascular:     Rate and Rhythm: Normal rate and regular rhythm.  Abdominal:     Palpations: Abdomen is soft.     Tenderness: There is abdominal tenderness in the suprapubic area. There is no right CVA tenderness or left CVA tenderness.    Genitourinary:    Labia:        Right: No tenderness or lesion.        Left: No tenderness or lesion.      Vagina: No vaginal discharge or prolapsed vaginal walls.     Cervix: No discharge.     Uterus: Normal.      Comments: Cystocele  Neurological:     Mental Status: She is alert.          Assessment & Plan:   >35 minutes spent face to face with patient, >50% spent counseling or coordinating care.    This visit occurred during the SARS-CoV-2 public health emergency.  Safety protocols were in place, including screening questions prior to the visit, additional  usage of staff PPE, and extensive cleaning of exam room while observing appropriate contact time as indicated for disinfecting solutions.

## 2021-12-10 NOTE — Assessment & Plan Note (Signed)
Discussed the possibility of a cystocele. We also discussed management and treatment including Kegal Exercises, pelvic floor PT, pessary, etc.  She declines treatment at this time but will update if she changes her mind. Handout provided today for Kegal exercises.

## 2021-12-10 NOTE — Assessment & Plan Note (Addendum)
Acute on chronic, also with new symptoms that could suggest cystitis.  UA today negative for infection.  Pelvic exam today with evidence of cystocele.   We also discussed management and treatment including Kegal Exercises, pelvic floor PT, pessary, etc.  She declines treatment at this time but will update if she changes her mind. Handout provided today for Kegal exercises.

## 2021-12-10 NOTE — Patient Instructions (Signed)
Consider applying a vaginal moisturizer a few times weekly for dryness and irritation.  Try the Kegel exercises to help strengthen the pelvic muscles.   It was a pleasure to see you today!  Kegel Exercises Kegel exercises can help strengthen your pelvic floor muscles. The pelvic floor is a group of muscles that support your rectum, small intestine, and bladder. In females, pelvic floor muscles also help support the uterus. These muscles help you control the flow of urine and stool (feces). Kegel exercises are painless and simple. They do not require any equipment. Your provider may suggest Kegel exercises to: Improve bladder and bowel control. Improve sexual response. Improve weak pelvic floor muscles after surgery to remove the uterus (hysterectomy) or after pregnancy, in females. Improve weak pelvic floor muscles after prostate gland removal or surgery, in males. Kegel exercises involve squeezing your pelvic floor muscles. These are the same muscles you squeeze when you try to stop the flow of urine or keep from passing gas. The exercises can be done while sitting, standing, or lying down, but it is best to vary your position. Ask your health care provider which exercises are safe for you. Do exercises exactly as told by your health care provider and adjust them as directed. Do not begin these exercises until told by your health care provider. Exercises How to do Kegel exercises: Squeeze your pelvic floor muscles tight. You should feel a tight lift in your rectal area. If you are a female, you should also feel a tightness in your vaginal area. Keep your stomach, buttocks, and legs relaxed. Hold the muscles tight for up to 10 seconds. Breathe normally. Relax your muscles for up to 10 seconds. Repeat as told by your health care provider. Repeat this exercise daily as told by your health care provider. Continue to do this exercise for at least 4-6 weeks, or for as long as told by your health  care provider. You may be referred to a physical therapist who can help you learn more about how to do Kegel exercises. Depending on your condition, your health care provider may recommend: Varying how long you squeeze your muscles. Doing several sets of exercises every day. Doing exercises for several weeks. Making Kegel exercises a part of your regular exercise routine. This information is not intended to replace advice given to you by your health care provider. Make sure you discuss any questions you have with your health care provider. Document Revised: 03/12/2021 Document Reviewed: 03/12/2021 Elsevier Patient Education  2022 [employer].

## 2021-12-10 NOTE — Assessment & Plan Note (Signed)
With urination.  UA today negative. Suspect vaginal atrophy to be contributing. She does notice dryness.  Discussed OTC vaginal moisturizers, she will start with that. If no improvement the consider low dose Estrace cream.   She will update.

## 2022-02-12 ENCOUNTER — Other Ambulatory Visit: Payer: Self-pay | Admitting: Primary Care

## 2022-02-12 DIAGNOSIS — Z1231 Encounter for screening mammogram for malignant neoplasm of breast: Secondary | ICD-10-CM

## 2022-03-23 ENCOUNTER — Ambulatory Visit
Admission: RE | Admit: 2022-03-23 | Discharge: 2022-03-23 | Disposition: A | Discharge status: Home / Self Care | Payer: Medicare HMO | Source: Ambulatory Visit | Attending: Primary Care | Admitting: Primary Care

## 2022-03-23 DIAGNOSIS — Z1231 Encounter for screening mammogram for malignant neoplasm of breast: Secondary | ICD-10-CM | POA: Insufficient documentation

## 2022-04-09 DIAGNOSIS — D2262 Melanocytic nevi of left upper limb, including shoulder: Secondary | ICD-10-CM | POA: Diagnosis not present

## 2022-04-09 DIAGNOSIS — L814 Other melanin hyperpigmentation: Secondary | ICD-10-CM | POA: Diagnosis not present

## 2022-04-09 DIAGNOSIS — Z85828 Personal history of other malignant neoplasm of skin: Secondary | ICD-10-CM | POA: Diagnosis not present

## 2022-04-09 DIAGNOSIS — D2272 Melanocytic nevi of left lower limb, including hip: Secondary | ICD-10-CM | POA: Diagnosis not present

## 2022-04-09 DIAGNOSIS — D2261 Melanocytic nevi of right upper limb, including shoulder: Secondary | ICD-10-CM | POA: Diagnosis not present

## 2022-06-10 DIAGNOSIS — H10022 Other mucopurulent conjunctivitis, left eye: Secondary | ICD-10-CM | POA: Diagnosis not present

## 2022-06-21 DIAGNOSIS — H10022 Other mucopurulent conjunctivitis, left eye: Secondary | ICD-10-CM | POA: Diagnosis not present

## 2022-08-16 DIAGNOSIS — Z8249 Family history of ischemic heart disease and other diseases of the circulatory system: Secondary | ICD-10-CM | POA: Diagnosis not present

## 2022-08-16 DIAGNOSIS — G629 Polyneuropathy, unspecified: Secondary | ICD-10-CM | POA: Diagnosis not present

## 2022-08-16 DIAGNOSIS — M8580 Other specified disorders of bone density and structure, unspecified site: Secondary | ICD-10-CM | POA: Diagnosis not present

## 2022-08-16 DIAGNOSIS — R32 Unspecified urinary incontinence: Secondary | ICD-10-CM | POA: Diagnosis not present

## 2022-08-16 DIAGNOSIS — M1990 Unspecified osteoarthritis, unspecified site: Secondary | ICD-10-CM | POA: Diagnosis not present

## 2022-08-23 DIAGNOSIS — H25813 Combined forms of age-related cataract, bilateral: Secondary | ICD-10-CM | POA: Diagnosis not present

## 2022-08-23 DIAGNOSIS — H524 Presbyopia: Secondary | ICD-10-CM | POA: Diagnosis not present

## 2022-11-01 ENCOUNTER — Telehealth: Payer: Self-pay | Admitting: Primary Care

## 2022-11-01 NOTE — Telephone Encounter (Signed)
LVM for pt to rtn my call to schedule AWV with [facility] call back # [phone] 

## 2022-11-02 ENCOUNTER — Encounter: Payer: Self-pay | Admitting: Primary Care

## 2022-11-02 ENCOUNTER — Ambulatory Visit (INDEPENDENT_AMBULATORY_CARE_PROVIDER_SITE_OTHER): Payer: Medicare HMO

## 2022-11-02 ENCOUNTER — Ambulatory Visit (INDEPENDENT_AMBULATORY_CARE_PROVIDER_SITE_OTHER): Payer: Medicare HMO | Admitting: Primary Care

## 2022-11-02 VITALS — BP 128/64 | HR 60 | Temp 97.2°F | Ht 62.0 in | Wt 118.0 lb

## 2022-11-02 DIAGNOSIS — M25519 Pain in unspecified shoulder: Secondary | ICD-10-CM

## 2022-11-02 DIAGNOSIS — Z0001 Encounter for general adult medical examination with abnormal findings: Secondary | ICD-10-CM | POA: Diagnosis not present

## 2022-11-02 DIAGNOSIS — E785 Hyperlipidemia, unspecified: Secondary | ICD-10-CM

## 2022-11-02 DIAGNOSIS — G8929 Other chronic pain: Secondary | ICD-10-CM

## 2022-11-02 DIAGNOSIS — M7060 Trochanteric bursitis, unspecified hip: Secondary | ICD-10-CM | POA: Diagnosis not present

## 2022-11-02 DIAGNOSIS — Z0000 Encounter for general adult medical examination without abnormal findings: Secondary | ICD-10-CM

## 2022-11-02 DIAGNOSIS — M8580 Other specified disorders of bone density and structure, unspecified site: Secondary | ICD-10-CM

## 2022-11-02 MED ORDER — MELOXICAM 15 MG PO TABS: 15.0000 mg | ORAL_TABLET | Freq: Every day | ORAL | 0 refills | Status: DC | PRN

## 2022-11-02 NOTE — Assessment & Plan Note (Signed)
Intermittent.  Refill provided for Meloxicam '15mg'$  to use PRN.

## 2022-11-02 NOTE — Progress Notes (Signed)
I connected with  Robyn House on 11/02/22 by a audio enabled telemedicine application and verified that I am speaking with the correct person using two identifiers.  Patient Location: Home  Provider Location: Office/Clinic  I discussed the limitations of evaluation and management by telemedicine. The patient expressed understanding and agreed to proceed.   Subjective:   Robyn House is a 71 y.o. female who presents for Medicare Annual (Subsequent) preventive examination.  Review of Systems     Cardiac Risk Factors include: advanced age (>70mn, >>13women);dyslipidemia     Objective:    There were no vitals filed for this visit. There is no height or weight on file to calculate BMI.     11/02/2022    1:33 PM 10/06/2021   11:19 AM 10/03/2019   12:02 PM  Advanced Directives  Does Patient Have a Medical Advance Directive? Yes Yes No  Type of AParamed[place]of [place] will [place] East[place] will   Does patient want to make changes to medical advance directive?  Yes (MAU/Ambulatory/Procedural Areas - Information given)   Copy of [place] Chart? No - copy requested    Would patient like information on creating a medical advance directive?   Yes (MAU/Ambulatory/Procedural Areas - Information given)    Current Medications (verified) Outpatient Encounter Medications as of 11/02/2022  Medication Sig   Cholecalciferol (VITAMIN D-3 PO) Take by mouth.   meloxicam (MOBIC) 15 MG tablet Take 1 tablet (15 mg total) by mouth daily as needed for pain.   MULTIPLE VITAMINS PO Take by mouth.   [DISCONTINUED] meloxicam (MOBIC) 15 MG tablet Take 15 mg by mouth daily. (Patient not taking: Reported on 11/02/2022)   No facility-administered encounter medications on file as of 11/02/2022.    Allergies (verified) Patient has no known allergies.   History: Past Medical History:  Diagnosis Date   Anemia    distant past    Anorexia    college   Cervicalgia    Had MRI, seeing neurosurgeon 09/22/15   Community acquired pneumonia 11/28/2018   Depression    Hammertoe    LEFT 2ND   Stiff neck    limited turn to left   Past Surgical History:  Procedure Laterality Date   CORRECTION OVERLAPPING TOES Left 09/03/2015   Procedure: CORRECTION OVERLAPPING TOE LEFT SECOND TOE;  Surgeon: JSamara Deist DPM;  Location[facility]  Service: Podiatry;  Laterality: Left;   HALLUX VALGUS LAPIDUS Left 09/03/2015   Procedure: HALLUX VALGUS LAPIDUS LEFT FOOT;  Surgeon: JSamara Deist DPM;  Location[facility]  Service: Podiatry;  Laterality: Left;  WITH POPLITEAL 2ND CASE PER SCHEDULING SHEET   HAMMER TOE SURGERY Left 09/03/2015   Procedure: HAMMER TOE CORRECTION LEFT SECOND TOE;  Surgeon: JSamara Deist DPM;  Location[facility]  Service: Podiatry;  Laterality: Left;   INNER EAR SURGERY     Right ear drum collapsed, Dr. MTami Ribas  SHOULDER SURGERY Right    Rotator cuff repair   TONSILECTOMY/ADENOIDECTOMY WITH MYRINGOTOMY     Family History  Problem Relation Age of Onset   Stroke Mother    Heart disease Mother        arrhythmia   Stroke Father    Arthritis Maternal Grandmother    Heart disease Maternal Grandfather    Breast cancer Other 355  Social History   Socioeconomic History   Marital status: Married    Spouse name: Not on file  Number of children: Not on file   Years of education: Not on file   Highest education level: Not on file  Occupational History   Not on file  Tobacco Use   Smoking status: Never   Smokeless tobacco: Never  Substance and Sexual Activity   Alcohol use: Yes    Alcohol/week: 7.0 standard drinks of alcohol    Types: 7 Glasses of wine per week   Drug use: No   Sexual activity: Not on file  Other Topics Concern   Not on file  Social History Narrative   Lives in [place] with husband.    Has 3 children and 1 step son, 61, 25, 99, 62. 1 son in  [place], step son in [place]. 2 sons in [place].    No pets.   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    Social Determinants of Health   Financial Resource Strain: Low Risk  (11/02/2022)   Overall Financial Resource Strain (CARDIA)    Difficulty of Paying Living Expenses: Not hard at all  Food Insecurity: No Food Insecurity (11/02/2022)   Hunger Vital Sign    Worried About Running Out of Food in the Last Year: Never true    Ran Out of Food in the Last Year: Never true  Transportation Needs: No Transportation Needs (11/02/2022)   PRAPARE - Hydrologist (Medical): No    Lack of Transportation (Non-Medical): No  Physical Activity: Sufficiently Active (11/02/2022)   Exercise Vital Sign    Days of Exercise per Week: 7 days    Minutes of Exercise per Session: 90 min  Stress: No Stress Concern Present (11/02/2022)   [place]    Feeling of Stress : Not at all  Social Connections: [place] (11/02/2022)   Social Connection and Isolation Panel [NHANES]    Frequency of Communication with Friends and Family: More than three times a week    Frequency of Social Gatherings with Friends and Family: More than three times a week    Attends Religious Services: More than 4 times per year    Active Member of [employer] or Organizations: Yes    Attends Archivist Meetings: 1 to 4 times per year    Marital Status: Married    Tobacco Counseling Counseling given: Not Answered   Clinical Intake:  Pre-visit preparation completed: Yes  Pain : No/denies pain     BMI - recorded: 21.58 Nutritional Status: BMI of 19-24  Normal Nutritional Risks: None Diabetes: No  How often do you need to have someone help you when you read instructions, pamphlets, or other written  materials from your doctor or pharmacy?: 1 - Never  Diabetic?no  Interpreter Needed?: No  Information entered by :: Charlott Rakes, LPN   Activities of Daily Living    11/02/2022    1:34 PM 11/02/2022   11:11 AM  In your present state of health, do you have any difficulty performing the following activities:  Hearing? 0 0  Vision? 0 0  Difficulty concentrating or making decisions? 0 0  Walking or climbing stairs? 0 0  Dressing or bathing? 0 0  Doing errands, shopping? 0 0  Preparing Food and eating ? N   Using the Toilet? N   In the past six months, have you accidently  leaked urine? N   Do you have problems with loss of bowel control? N   Managing your Medications? N   Managing your Finances? N   Housekeeping or managing your Housekeeping? N     Patient Care Team: Pleas Koch, NP as PCP - General (Internal Medicine)  Indicate any recent Medical Services you may have received from other than [facility] providers in the past year (date may be approximate).     Assessment:   This is a routine wellness examination for [place].  Hearing/Vision screen Hearing Screening - Comments:: Pt denies any hearing issues  Vision Screening - Comments:: Pt follows up with Dr Gloriann Loan for annul eye exams   Dietary issues and exercise activities discussed: Current Exercise Habits: Home exercise routine, Type of exercise: Other - see comments, Time (Minutes): > 60, Frequency (Times/Week): 7, Weekly Exercise (Minutes/Week): 0   Goals Addressed             This Visit's Progress    Patient Stated       Maintain health and activity       Depression Screen    11/02/2022    1:31 PM 11/02/2022   11:11 AM 10/30/2021   11:36 AM 10/06/2021   11:25 AM 10/13/2020    9:16 AM 10/03/2019   12:05 PM 07/21/2017    7:34 AM  PHQ 2/9 Scores  PHQ - 2 Score 0 0 1 0 0 0 0  PHQ- 9 Score 0 0 2   0     Fall Risk    11/02/2022    1:33 PM 11/02/2022   11:11 AM 10/06/2021   11:24 AM 10/13/2020     9:17 AM 10/03/2019   12:04 PM  Pray in the past year? 0 0 0 0 0  Number falls in past yr: 0 0 0 0 0  Injury with Fall? 0 0 0 0 0  Risk for fall due to : Impaired vision No Fall Risks No Fall Risks    Risk for fall due to: Comment readers      Follow up Falls prevention discussed Falls evaluation completed Falls prevention discussed  Falls evaluation completed;Falls prevention discussed    FALL RISK PREVENTION PERTAINING TO THE HOME:  Any stairs in or around the home? Yes  If so, are there any without handrails? No  Home free of loose throw rugs in walkways, pet beds, electrical cords, etc? Yes  Adequate lighting in your home to reduce risk of falls? Yes   ASSISTIVE DEVICES UTILIZED TO PREVENT FALLS:  Life alert? No  Use of a cane, walker or w/c? No  Grab bars in the bathroom? Yes  Shower chair or bench in shower? Yes  Elevated toilet seat or a handicapped toilet? No   TIMED UP AND GO:  Was the test performed? No .   Cognitive Function:    10/03/2019   12:08 PM  MMSE - Mini Mental State Exam  Orientation to time 5  Orientation to Place 5  Registration 3  Attention/ Calculation 5  Recall 3  Language- repeat 1        11/02/2022    1:34 PM  6CIT Screen  What Year? 0 points  What month? 0 points  What time? 0 points  Count back from 20 0 points  Months in reverse 0 points  Repeat phrase 0 points  Total Score 0 points    Immunizations Immunization History  Administered Date(s) Administered   Influenza,inj,Quad  PF,6+ Mos 10/25/2019   Influenza-Unspecified 09/08/2020, 09/04/2021   PFIZER(Purple Top)SARS-COV-2 Vaccination 12/11/2019, 01/01/2020   Pneumococcal Conjugate-13 07/15/2016   Pneumococcal Polysaccharide-23 07/21/2017   Tdap 07/11/2015   Zoster Recombinat (Shingrix) 09/27/2018, 12/27/2018    TDAP status: Up to date  Flu Vaccine status: Due, Education has been provided regarding the importance of this vaccine. Advised may receive this  vaccine at local pharmacy or Health Dept. Aware to provide a copy of the vaccination record if obtained from local pharmacy or Health Dept. Verbalized acceptance and understanding.  Pneumococcal vaccine status: Up to date  Covid-19 vaccine status: Completed vaccines  Qualifies for Shingles Vaccine? Yes   Zostavax completed Yes   Shingrix Completed?: Yes  Screening Tests Health Maintenance  Topic Date Due   COVID-19 Vaccine (3 - 2023-24 season) 11/18/2022 (Originally 07/16/2022)   INFLUENZA VACCINE  02/13/2023 (Originally 06/15/2022)   Medicare Annual Wellness (AWV)  11/03/2023   MAMMOGRAM  03/23/2024   DTaP/Tdap/Td (2 - Td or Tdap) 07/10/2025   COLONOSCOPY (Pts 45-80yr Insurance coverage will need to be confirmed)  12/25/2028   Pneumonia Vaccine 72 Years old  Completed   DEXA SCAN  Completed   Hepatitis C Screening  Completed   Zoster Vaccines- Shingrix  Completed   HPV VACCINES  Aged Out    Health Maintenance  There are no preventive care reminders to display for this patient.   Colorectal cancer screening: Type of screening: Colonoscopy. Completed 12/25/18. Repeat every 10 years  Mammogram status: Completed 03/23/22. Repeat every year  Bone Density status: Completed 03/03/21. Results reflect: Bone density results: OSTEOPENIA. Repeat every 2 years.   Additional Screening:  Hepatitis C Screening: Completed 07/08/16  Vision Screening: Recommended annual ophthalmology exams for early detection of glaucoma and other disorders of the eye. Is the patient up to date with their annual eye exam?  Yes  Who is the provider or what is the name of the office in which the patient attends annual eye exams? Dr PLavella Lemons If pt is not established with a provider, would they like to be referred to a provider to establish care? No .   Dental Screening: Recommended annual dental exams for proper oral hygiene  Community Resource Referral / Chronic Care Management: CRR required this visit?   No   CCM required this visit?  No      Plan:     I have personally reviewed and noted the following in the patient's chart:   Medical and social history Use of alcohol, tobacco or illicit drugs  Current medications and supplements including opioid prescriptions. Patient is not currently taking opioid prescriptions. Functional ability and status Nutritional status Physical activity Advanced directives List of other physicians Hospitalizations, surgeries, and ER visits in previous 12 months Vitals Screenings to include cognitive, depression, and falls Referrals and appointments  In addition, I have reviewed and discussed with patient certain preventive protocols, quality metrics, and best practice recommendations. A written personalized care plan for preventive services as well as general preventive health recommendations were provided to patient.     TWillette Brace LPN   [phone]  Nurse Notes: none

## 2022-11-02 NOTE — Assessment & Plan Note (Signed)
Immunizations UTD. Declines influenza vaccine today. Mammogram and bone density scan UTD. Colonosocpy UTD, no further testing needed per patient.   Discussed the importance of a healthy diet and regular exercise in order for weight loss, and to reduce the risk of further co-morbidity.  Exam stable. Labs pending.  Follow up in 1 year for repeat physical.

## 2022-11-02 NOTE — Assessment & Plan Note (Signed)
Controlled over the last few years.  Commended her on regular exercise and a healthy diet.  Will defer labs until 2024. She agrees.   Reviewed labs from December 2021 and 2022.

## 2022-11-02 NOTE — Patient Instructions (Signed)
It was a pleasure to see you today!  Preventive Care 39 Years and Older, Female Preventive care refers to lifestyle choices and visits with your health care provider that can promote health and wellness. Preventive care visits are also called wellness exams. What can I expect for my preventive care visit? Counseling Your health care provider may ask you questions about your: Medical history, including: Past medical problems. Family medical history. Pregnancy and menstrual history. History of falls. Current health, including: Memory and ability to understand (cognition). Emotional well-being. Home life and relationship well-being. Sexual activity and sexual health. Lifestyle, including: Alcohol, nicotine or tobacco, and drug use. Access to firearms. Diet, exercise, and sleep habits. Work and work Statistician. Sunscreen use. Safety issues such as seatbelt and bike helmet use. Physical exam Your health care provider will check your: Height and weight. These may be used to calculate your BMI (body mass index). BMI is a measurement that tells if you are at a healthy weight. Waist circumference. This measures the distance around your waistline. This measurement also tells if you are at a healthy weight and may help predict your risk of certain diseases, such as type 2 diabetes and high blood pressure. Heart rate and blood pressure. Body temperature. Skin for abnormal spots. What immunizations do I need?  Vaccines are usually given at various ages, according to a schedule. Your health care provider will recommend vaccines for you based on your age, medical history, and lifestyle or other factors, such as travel or where you work. What tests do I need? Screening Your health care provider may recommend screening tests for certain conditions. This may include: Lipid and cholesterol levels. Hepatitis C test. Hepatitis B test. HIV (human immunodeficiency virus) test. STI (sexually  transmitted infection) testing, if you are at risk. Lung cancer screening. Colorectal cancer screening. Diabetes screening. This is done by checking your blood sugar (glucose) after you have not eaten for a while (fasting). Mammogram. Talk with your health care provider about how often you should have regular mammograms. BRCA-related cancer screening. This may be done if you have a family history of breast, ovarian, tubal, or peritoneal cancers. Bone density scan. This is done to screen for osteoporosis. Talk with your health care provider about your test results, treatment options, and if necessary, the need for more tests. Follow these instructions at home: Eating and drinking  Eat a diet that includes fresh fruits and vegetables, whole grains, lean protein, and low-fat dairy products. Limit your intake of foods with high amounts of sugar, saturated fats, and salt. Take vitamin and mineral supplements as recommended by your health care provider. Do not drink alcohol if your health care provider tells you not to drink. If you drink alcohol: Limit how much you have to 0-1 drink a day. Know how much alcohol is in your drink. In the [place] one drink equals one 12 oz bottle of beer (355 mL), one 5 oz glass of wine (148 mL), or one 1 oz glass of hard liquor (44 mL). Lifestyle Brush your teeth every morning and night with fluoride toothpaste. Floss one time each day. Exercise for at least 30 minutes 5 or more days each week. Do not use any products that contain nicotine or tobacco. These products include cigarettes, chewing tobacco, and vaping devices, such as e-cigarettes. If you need help quitting, ask your health care provider. Do not use drugs. If you are sexually active, practice safe sex. Use a condom or other form of protection in order  to prevent STIs. Take aspirin only as told by your health care provider. Make sure that you understand how much to take and what form to take. Work with your  health care provider to find out whether it is safe and beneficial for you to take aspirin daily. Ask your health care provider if you need to take a cholesterol-lowering medicine (statin). Find healthy ways to manage stress, such as: Meditation, yoga, or listening to music. Journaling. Talking to a trusted person. Spending time with friends and family. Minimize exposure to UV radiation to reduce your risk of skin cancer. Safety Always wear your seat belt while driving or riding in a vehicle. Do not drive: If you have been drinking alcohol. Do not ride with someone who has been drinking. When you are tired or distracted. While texting. If you have been using any mind-altering substances or drugs. Wear a helmet and other protective equipment during sports activities. If you have firearms in your house, make sure you follow all gun safety procedures. What's next? Visit your health care provider once a year for an annual wellness visit. Ask your health care provider how often you should have your eyes and teeth checked. Stay up to date on all vaccines. This information is not intended to replace advice given to you by your health care provider. Make sure you discuss any questions you have with your health care provider. Document Revised: 04/29/2021 Document Reviewed: 04/29/2021 Elsevier Patient Education  [place].

## 2022-11-02 NOTE — Assessment & Plan Note (Signed)
Intermittent. Stable.

## 2022-11-02 NOTE — Assessment & Plan Note (Signed)
Bone density scan UTD.  Continue calcium and vitamin D.

## 2022-11-02 NOTE — Patient Instructions (Signed)
Robyn House , Thank you for taking time to come for your Medicare Wellness Visit. I appreciate your ongoing commitment to your health goals. Please review the following plan we discussed and let me know if I can assist you in the future.   These are the goals we discussed:  Goals      Patient Stated     10/03/2019, I will maintain and continue medications as prescribed.      Patient Stated     Would lie to drink more water and maintain current health.        This is a list of the screening recommended for you and due dates:  Health Maintenance  Topic Date Due   COVID-19 Vaccine (3 - 2023-24 season) 11/18/2022*   Flu Shot  02/13/2023*   Medicare Annual Wellness Visit  11/03/2023   Mammogram  03/23/2024   DTaP/Tdap/Td vaccine (2 - Td or Tdap) 07/10/2025   Colon Cancer Screening  12/25/2028   Pneumonia Vaccine  Completed   DEXA scan (bone density measurement)  Completed   Hepatitis C Screening: USPSTF Recommendation to screen - Ages 62-79 yo.  Completed   Zoster (Shingles) Vaccine  Completed   HPV Vaccine  Aged Out  *Topic was postponed. The date shown is not the original due date.    Advanced directives: Please bring a copy of your health care power of attorney and living will to the office at your convenience.  Conditions/risks identified: maintain health and activity   Next appointment: Follow up in one year for your annual wellness visit    Preventive Care 65 Years and Older, Female Preventive care refers to lifestyle choices and visits with your health care provider that can promote health and wellness. What does preventive care include? A yearly physical exam. This is also called an annual well check. Dental exams once or twice a year. Routine eye exams. Ask your health care provider how often you should have your eyes checked. Personal lifestyle choices, including: Daily care of your teeth and gums. Regular physical activity. Eating a healthy diet. Avoiding  tobacco and drug use. Limiting alcohol use. Practicing safe sex. Taking low-dose aspirin every day. Taking vitamin and mineral supplements as recommended by your health care provider. What happens during an annual well check? The services and screenings done by your health care provider during your annual well check will depend on your age, overall health, lifestyle risk factors, and family history of disease. Counseling  Your health care provider may ask you questions about your: Alcohol use. Tobacco use. Drug use. Emotional well-being. Home and relationship well-being. Sexual activity. Eating habits. History of falls. Memory and ability to understand (cognition). Work and work Statistician. Reproductive health. Screening  You may have the following tests or measurements: Height, weight, and BMI. Blood pressure. Lipid and cholesterol levels. These may be checked every 5 years, or more frequently if you are over 69 years old. Skin check. Lung cancer screening. You may have this screening every year starting at age 61 if you have a 30-pack-year history of smoking and currently smoke or have quit within the past 15 years. Fecal occult blood test (FOBT) of the stool. You may have this test every year starting at age 51. Flexible sigmoidoscopy or colonoscopy. You may have a sigmoidoscopy every 5 years or a colonoscopy every 10 years starting at age 92. Hepatitis C blood test. Hepatitis B blood test. Sexually transmitted disease (STD) testing. Diabetes screening. This is done by checking your blood  sugar (glucose) after you have not eaten for a while (fasting). You may have this done every 1-3 years. Bone density scan. This is done to screen for osteoporosis. You may have this done starting at age 60. Mammogram. This may be done every 1-2 years. Talk to your health care provider about how often you should have regular mammograms. Talk with your health care provider about your test  results, treatment options, and if necessary, the need for more tests. Vaccines  Your health care provider may recommend certain vaccines, such as: Influenza vaccine. This is recommended every year. Tetanus, diphtheria, and acellular pertussis (Tdap, Td) vaccine. You may need a Td booster every 10 years. Zoster vaccine. You may need this after age 74. Pneumococcal 13-valent conjugate (PCV13) vaccine. One dose is recommended after age 19. Pneumococcal polysaccharide (PPSV23) vaccine. One dose is recommended after age 72. Talk to your health care provider about which screenings and vaccines you need and how often you need them. This information is not intended to replace advice given to you by your health care provider. Make sure you discuss any questions you have with your health care provider. Document Released: 11/28/2015 Document Revised: 07/21/2016 Document Reviewed: 09/02/2015 Elsevier Interactive Patient Education  2017 [place] Prevention in the Home Falls can cause injuries. They can happen to people of all ages. There are many things you can do to make your home safe and to help prevent falls. What can I do on the outside of my home? Regularly fix the edges of walkways and driveways and fix any cracks. Remove anything that might make you trip as you walk through a door, such as a raised step or threshold. Trim any bushes or trees on the path to your home. Use bright outdoor lighting. Clear any walking paths of anything that might make someone trip, such as rocks or tools. Regularly check to see if handrails are loose or broken. Make sure that both sides of any steps have handrails. Any raised decks and porches should have guardrails on the edges. Have any leaves, snow, or ice cleared regularly. Use sand or salt on walking paths during winter. Clean up any spills in your garage right away. This includes oil or grease spills. What can I do in the bathroom? Use night  lights. Install grab bars by the toilet and in the tub and shower. Do not use towel bars as grab bars. Use non-skid mats or decals in the tub or shower. If you need to sit down in the shower, use a plastic, non-slip stool. Keep the floor dry. Clean up any water that spills on the floor as soon as it happens. Remove soap buildup in the tub or shower regularly. Attach bath mats securely with double-sided non-slip rug tape. Do not have throw rugs and other things on the floor that can make you trip. What can I do in the bedroom? Use night lights. Make sure that you have a light by your bed that is easy to reach. Do not use any sheets or blankets that are too big for your bed. They should not hang down onto the floor. Have a firm chair that has side arms. You can use this for support while you get dressed. Do not have throw rugs and other things on the floor that can make you trip. What can I do in the kitchen? Clean up any spills right away. Avoid walking on wet floors. Keep items that you use a lot in easy-to-reach  places. If you need to reach something above you, use a strong step stool that has a grab bar. Keep electrical cords out of the way. Do not use floor polish or wax that makes floors slippery. If you must use wax, use non-skid floor wax. Do not have throw rugs and other things on the floor that can make you trip. What can I do with my stairs? Do not leave any items on the stairs. Make sure that there are handrails on both sides of the stairs and use them. Fix handrails that are broken or loose. Make sure that handrails are as long as the stairways. Check any carpeting to make sure that it is firmly attached to the stairs. Fix any carpet that is loose or worn. Avoid having throw rugs at the top or bottom of the stairs. If you do have throw rugs, attach them to the floor with carpet tape. Make sure that you have a light switch at the top of the stairs and the bottom of the stairs. If  you do not have them, ask someone to add them for you. What else can I do to help prevent falls? Wear shoes that: Do not have high heels. Have rubber bottoms. Are comfortable and fit you well. Are closed at the toe. Do not wear sandals. If you use a stepladder: Make sure that it is fully opened. Do not climb a closed stepladder. Make sure that both sides of the stepladder are locked into place. Ask someone to hold it for you, if possible. Clearly mark and make sure that you can see: Any grab bars or handrails. First and last steps. Where the edge of each step is. Use tools that help you move around (mobility aids) if they are needed. These include: Canes. Walkers. Scooters. Crutches. Turn on the lights when you go into a dark area. Replace any light bulbs as soon as they burn out. Set up your furniture so you have a clear path. Avoid moving your furniture around. If any of your floors are uneven, fix them. If there are any pets around you, be aware of where they are. Review your medicines with your doctor. Some medicines can make you feel dizzy. This can increase your chance of falling. Ask your doctor what other things that you can do to help prevent falls. This information is not intended to replace advice given to you by your health care provider. Make sure you discuss any questions you have with your health care provider. Document Released: 08/28/2009 Document Revised: 04/08/2016 Document Reviewed: 12/06/2014 Elsevier Interactive Patient Education  2017 [employer].

## 2022-11-02 NOTE — Progress Notes (Signed)
Subjective:    Patient ID: Robyn House, female    DOB: 10-Jan-1951, 71 y.o.   MRN: [id number]  HPI  Robyn House is a very pleasant 71 y.o. female who presents today for complete physical and follow up of chronic conditions.  Immunizations: -Tetanus: 2016 -Influenza: Declines today -Shingles: Completed Shingrix series -Pneumonia: Prevnar 13 in 2017, pneumovax in 2018  Diet: [place].  Exercise: Boot camp twice weekly, daily walking, pickle ball  Eye exam: Completes annually, last done in October 2023 Dental exam: Completes semi-annually   Mammogram: Completed in May 2023  Colonoscopy: Completed in 2020, no need for repeat per patient Dexa: Completed in 2022   BP Readings from Last 3 Encounters:  11/02/22 128/64  12/10/21 130/78  10/30/21 122/74        Review of Systems  Constitutional:  Negative for unexpected weight change.  HENT:  Negative for rhinorrhea.   Respiratory:  Negative for cough and shortness of breath.   Cardiovascular:  Negative for chest pain.  Gastrointestinal:  Negative for constipation and diarrhea.  Genitourinary:  Negative for difficulty urinating.  Musculoskeletal:  Positive for arthralgias.  Skin:  Negative for rash.  Allergic/Immunologic: Negative for environmental allergies.  Neurological:  Negative for dizziness and headaches.  Psychiatric/Behavioral:  The patient is not nervous/anxious.          Past Medical History:  Diagnosis Date   Anemia    distant past   Anorexia    college   Cervicalgia    Had MRI, seeing neurosurgeon 09/22/15   Community acquired pneumonia 11/28/2018   Depression    [place]    LEFT 2ND   Stiff neck    limited turn to left    Social History   Socioeconomic History   Marital status: Married    Spouse name: Not on file   Number of children: Not on file   Years of education: Not on file   Highest education level: Not on file  Occupational History   Not on file  Tobacco Use   Smoking  status: Never   Smokeless tobacco: Never  Substance and Sexual Activity   Alcohol use: Yes    Alcohol/week: 7.0 standard drinks of alcohol    Types: 7 Glasses of wine per week   Drug use: No   Sexual activity: Not on file  Other Topics Concern   Not on file  Social History Narrative   Lives in [place] with husband.    Has 3 children and 1 step son, 26, 36, 17, 40. 1 son in [place], step son in [place]. 2 sons in [place].    No pets.   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    Social Determinants of Health   Financial Resource Strain: Low Risk  (10/06/2021)   Overall Financial Resource Strain (CARDIA)    Difficulty of Paying Living Expenses: Not hard at all  Food Insecurity: No Food Insecurity (10/06/2021)   Hunger Vital Sign    Worried About Running Out of Food in the Last Year: Never true    Ran Out of Food in the Last Year: Never true  Transportation Needs: No Transportation Needs (10/06/2021)   PRAPARE - Hydrologist (Medical): No    Lack of Transportation (Non-Medical): No  Physical Activity: Sufficiently Active (10/06/2021)   Exercise  Vital Sign    Days of Exercise per Week: 7 days    Minutes of Exercise per Session: 60 min  Stress: No Stress Concern Present (10/06/2021)   [place]    Feeling of Stress : Not at all  Social Connections: [place] (10/06/2021)   Social Connection and Isolation Panel [NHANES]    Frequency of Communication with Friends and Family: More than three times a week    Frequency of Social Gatherings with Friends and Family: More than three times a week    Attends Religious Services: More than 4 times per year    Active Member of Clubs or Organizations: Yes    Attends Archivist Meetings: More than 4 times  per year    Marital Status: Married  Human resources officer Violence: Not At Risk (10/06/2021)   Humiliation, Afraid, Rape, and Kick questionnaire    Fear of Current or Ex-Partner: No    Emotionally Abused: No    Physically Abused: No    Sexually Abused: No    Past Surgical History:  Procedure Laterality Date   CORRECTION OVERLAPPING TOES Left 09/03/2015   Procedure: CORRECTION OVERLAPPING TOE LEFT SECOND TOE;  Surgeon: Samara Deist, DPM;  Location: [facility];  Service: Podiatry;  Laterality: Left;   HALLUX VALGUS LAPIDUS Left 09/03/2015   Procedure: HALLUX VALGUS LAPIDUS LEFT FOOT;  Surgeon: Samara Deist, DPM;  Location: [facility];  Service: Podiatry;  Laterality: Left;  WITH POPLITEAL 2ND CASE PER SCHEDULING SHEET   HAMMER TOE SURGERY Left 09/03/2015   Procedure: HAMMER TOE CORRECTION LEFT SECOND TOE;  Surgeon: Samara Deist, DPM;  Location: [facility];  Service: Podiatry;  Laterality: Left;   INNER EAR SURGERY     Right ear drum collapsed, Dr. Tami Ribas   SHOULDER SURGERY Right    Rotator cuff repair   TONSILECTOMY/ADENOIDECTOMY WITH MYRINGOTOMY      Family History  Problem Relation Age of Onset   Stroke Mother    Heart disease Mother        arrhythmia   Stroke Father    Arthritis Maternal Grandmother    Heart disease Maternal Grandfather    Breast cancer Other 39    No Known Allergies  Current Outpatient Medications on File Prior to Visit  Medication Sig Dispense Refill   Cholecalciferol (VITAMIN D-3 PO) Take by mouth.     MULTIPLE VITAMINS PO Take by mouth.     No current facility-administered medications on file prior to visit.    BP 128/64   Pulse 60   Temp (!) 97.2 F (36.2 C) (Temporal)   Ht '5\' 2"'$  (1.575 m)   Wt 118 lb (53.5 kg)   SpO2 98%   BMI 21.58 kg/m  Objective:   Physical Exam HENT:     Right Ear: Tympanic membrane and ear canal normal.     Left Ear: Tympanic membrane and ear canal normal.     Nose: Nose normal.   Eyes:     Conjunctiva/sclera: Conjunctivae normal.     Pupils: Pupils are equal, round, and reactive to light.  Neck:     Thyroid: No thyromegaly.  Cardiovascular:     Rate and Rhythm: Normal rate and regular rhythm.     Heart sounds: No murmur heard. Pulmonary:     Effort: Pulmonary effort is normal.     Breath sounds: Normal breath sounds. No rales.  Abdominal:     General: Bowel sounds are normal.  Palpations: Abdomen is soft.     Tenderness: There is no abdominal tenderness.  Musculoskeletal:        General: Normal range of motion.     Cervical back: Neck supple.  Lymphadenopathy:     Cervical: No cervical adenopathy.  Skin:    General: Skin is warm and dry.     Findings: No rash.  Neurological:     Mental Status: She is alert and oriented to person, place, and time.     Cranial Nerves: No cranial nerve deficit.     Deep Tendon Reflexes: Reflexes are normal and symmetric.  Psychiatric:        Mood and Affect: Mood normal.           Assessment & Plan:   Problem List Items Addressed This Visit       Musculoskeletal and Integument   Osteopenia - Primary    Bone density scan UTD.  Continue calcium and vitamin D.       Trochanteric bursitis    Intermittent. Stable.      Relevant Medications   meloxicam (MOBIC) 15 MG tablet     Other   Routine general medical examination at a health care facility    Immunizations UTD. Declines influenza vaccine today. Mammogram and bone density scan UTD. Colonosocpy UTD, no further testing needed per patient.   Discussed the importance of a healthy diet and regular exercise in order for weight loss, and to reduce the risk of further co-morbidity.  Exam stable. Labs pending.  Follow up in 1 year for repeat physical.       Hyperlipidemia    Controlled over the last few years.  Commended her on regular exercise and a healthy diet.  Will defer labs until 2024. She agrees.   Reviewed labs from December 2021  and 2022.      Chronic shoulder pain    Intermittent.  Refill provided for Meloxicam '15mg'$  to use PRN.      Relevant Medications   meloxicam (MOBIC) 15 MG tablet       Pleas Koch, NP

## 2022-11-18 DIAGNOSIS — R69 Illness, unspecified: Secondary | ICD-10-CM | POA: Diagnosis not present

## 2022-11-22 DIAGNOSIS — R69 Illness, unspecified: Secondary | ICD-10-CM | POA: Diagnosis not present

## 2023-01-05 DIAGNOSIS — R69 Illness, unspecified: Secondary | ICD-10-CM | POA: Diagnosis not present

## 2023-02-15 ENCOUNTER — Other Ambulatory Visit: Payer: Self-pay | Admitting: Primary Care

## 2023-02-15 DIAGNOSIS — Z1231 Encounter for screening mammogram for malignant neoplasm of breast: Secondary | ICD-10-CM

## 2023-02-17 DIAGNOSIS — R69 Illness, unspecified: Secondary | ICD-10-CM | POA: Diagnosis not present

## 2023-03-29 ENCOUNTER — Ambulatory Visit
Admission: RE | Admit: 2023-03-29 | Discharge: 2023-03-29 | Disposition: A | Discharge status: Home / Self Care | Payer: Medicare HMO | Source: Ambulatory Visit | Attending: Primary Care | Admitting: Primary Care

## 2023-03-29 DIAGNOSIS — Z1231 Encounter for screening mammogram for malignant neoplasm of breast: Secondary | ICD-10-CM

## 2023-04-06 ENCOUNTER — Other Ambulatory Visit: Payer: Self-pay

## 2023-04-06 ENCOUNTER — Emergency Department: Payer: Medicare HMO

## 2023-04-06 ENCOUNTER — Emergency Department
Admission: EM | Admit: 2023-04-06 | Discharge: 2023-04-06 | Disposition: A | Discharge status: Home / Self Care | Payer: Medicare HMO | Attending: Emergency Medicine | Admitting: Emergency Medicine

## 2023-04-06 DIAGNOSIS — S20212A Contusion of left front wall of thorax, initial encounter: Secondary | ICD-10-CM | POA: Diagnosis not present

## 2023-04-06 DIAGNOSIS — R0781 Pleurodynia: Secondary | ICD-10-CM | POA: Diagnosis not present

## 2023-04-06 DIAGNOSIS — S299XXA Unspecified injury of thorax, initial encounter: Secondary | ICD-10-CM | POA: Diagnosis not present

## 2023-04-06 DIAGNOSIS — Y92009 Unspecified place in unspecified non-institutional (private) residence as the place of occurrence of the external cause: Secondary | ICD-10-CM | POA: Diagnosis not present

## 2023-04-06 DIAGNOSIS — Z043 Encounter for examination and observation following other accident: Secondary | ICD-10-CM | POA: Diagnosis not present

## 2023-04-06 DIAGNOSIS — W010XXA Fall on same level from slipping, tripping and stumbling without subsequent striking against object, initial encounter: Secondary | ICD-10-CM | POA: Insufficient documentation

## 2023-04-06 DIAGNOSIS — M25532 Pain in left wrist: Secondary | ICD-10-CM | POA: Insufficient documentation

## 2023-04-06 NOTE — ED Provider Notes (Signed)
[facility] Provider Note    Event Date/Time   First MD Initiated Contact with Patient 04/06/23 2103     (approximate)   History   Fall and Rib Injury   HPI  Robyn House is a 72 y.o. female with PMH of depression, anorexia, anemia, cervicalgia who presents to the ED after a fall at home.  Patient states that she tripped and tried to catch herself with her left arm as she fell but was ultimately unsuccessful and landed on the concrete floor on her left side.  Patient endorses pain with deep breaths, but denies SOB, chest pain, LOC.  She did not hit her head.  She also endorses some pain in her left wrist.     Physical Exam   Triage Vital Signs: ED Triage Vitals  Enc Vitals Group     BP 04/06/23 2034 (!) 179/62     Pulse Rate 04/06/23 2034 65     Resp 04/06/23 2034 18     Temp 04/06/23 2034 98.1 F (36.7 C)     Temp Source 04/06/23 2034 Oral     SpO2 04/06/23 2034 100 %     Weight 04/06/23 2035 113 lb (51.3 kg)     Height 04/06/23 2035 5\' 3"  (1.6 m)     Head Circumference --      Peak Flow --      Pain Score 04/06/23 2034 9     Pain Loc --      Pain Edu? --      Excl. in GC? --     Most recent vital signs: Vitals:   04/06/23 2034  BP: (!) 179/62  Pulse: 65  Resp: 18  Temp: 98.1 F (36.7 C)  SpO2: 100%    General: Awake, no distress.  CV:  Good peripheral perfusion.  RRR, no murmurs rubs or gallops. Resp:  Normal effort.  Lungs CTA.  No wheezing. Abd:  No distention.  Left wrist: Mild tenderness to palpation, no snuffbox tenderness, full range of motion, 5 out of 5 strength.   ED Results / Procedures / Treatments   Labs (all labs ordered are listed, but only abnormal results are displayed) Labs Reviewed - No data to display   RADIOLOGY Left wrist x-ray and left ribs x-ray obtained in ED today.  I independently interpreted the images as well as reviewed the radiologist report.  PROCEDURES:  Critical Care performed:  No  Procedures   MEDICATIONS ORDERED IN ED: Medications - No data to display   IMPRESSION / MDM / ASSESSMENT AND PLAN / ED COURSE  I reviewed the triage vital signs and the nursing notes.                              Differential diagnosis includes, but is not limited to, left wrist fracture, left wrist sprain, left wrist contusion, left rib fracture, left rib contusion, pneumothorax, pericardial effusion.  Patient's presentation is most consistent with acute illness / injury with system symptoms.  Patient presented to ED today after a fall at home.  She reported pain in her left ribs and left wrist.  X-rays of ribs and wrist were obtained.  I independently reviewed the images as well as the radiologist report, which showed no fractures of the ribs or wrist, and no pneumothorax.  Based on the negative x-rays and normal findings on physical exam I believe she has a left rib contusion.  I discussed symptomatic management at home with Tylenol, Motrin, and ice packs.  Went over red flag symptoms to watch out for including development of chest pain or significant shortness of breath.  Instructed patient to take it easy for the next few days, she can return to activity as tolerated.  Also discussed getting a repeat wrist x-ray if she still has pain in 1 week to look for an occult fracture of the scaphoid bone.  Patient verbalized understanding and was agreeable to plan.  Patient stable for discharge.      FINAL CLINICAL IMPRESSION(S) / ED DIAGNOSES   Final diagnoses:  Contusion of rib on left side, initial encounter     Rx / DC Orders   ED Discharge Orders     None        Note:  This document was prepared using Dragon voice recognition software and may include unintentional dictation errors.   Cruz Condon, PA-C 04/06/23 2232    Sharman Cheek, MD 04/06/23 2351

## 2023-04-06 NOTE — Discharge Instructions (Addendum)
You can take Tylenol and Motrin for pain management as needed.  You can also apply ice or topical analgesics for if you prefer. Please follow-up with your primary care for repeat wrist x-ray if still painful after 1 week.  Please return to the ED if you develop any shortness of breath or chest pain.  You can return to activity as tolerated. It was a pleasure to care for you today!

## 2023-04-06 NOTE — ED Triage Notes (Signed)
Pt presents to ER with c/o fall that happened around 1-2 hours ago when she tripped on some plastic in her garage.  Pt states when she fell, she landed on concrete floor, where she landed on the left side of her rib cage.  Pt reports she also may have put her left hand down to break the fall.  Pt reports she has some pain when taking a deep breath.  Pt also states area is tender to the touch in some areas.  Pt otherwise A&O x4 and in NAD in triage.

## 2023-04-21 ENCOUNTER — Encounter: Payer: Self-pay | Admitting: Primary Care

## 2023-04-21 ENCOUNTER — Ambulatory Visit (INDEPENDENT_AMBULATORY_CARE_PROVIDER_SITE_OTHER): Payer: Medicare HMO | Admitting: Primary Care

## 2023-04-21 VITALS — BP 148/80 | HR 66 | Temp 97.3°F | Ht 63.0 in | Wt 116.0 lb

## 2023-04-21 DIAGNOSIS — R0781 Pleurodynia: Secondary | ICD-10-CM

## 2023-04-21 HISTORY — DX: Pleurodynia: R07.81

## 2023-04-21 NOTE — Assessment & Plan Note (Signed)
Sequela since mechanical fall 2 weeks ago.  Fortunately, she is improving.  Discussed that rib contusions symptoms can last for weeks. Continue alternating Tylenol and ibuprofen as needed.  Discussed use of Voltaren gel.  The swelling noted on exam was likely from a hematoma that appears to be healing. No evidence of pneumothorax.  Discussed importance of deep breathing exercises.  She will advance slowly into her regular exercise routine.. Return precautions provided.

## 2023-04-21 NOTE — Progress Notes (Addendum)
Subjective:    Patient ID: Robyn House, female    DOB: 16-Apr-1951, 72 y.o.   MRN: [id number]  Fall    Robyn House is a very pleasant 72 y.o. female with a history of trochanteric bursitis, left supraspinatus tendon tear, chronic shoulder pain, chronic neck pain who presents today for ED follow-up.  She presented to [facility] ED on 04/06/2023 after a mechanical fall at home.  She accidentally tripped while in her garage, attempted to catch herself with her left arm, landed onto her left side onto concrete.  She did not hit her head.  During her stay in the ED she underwent x-rays of the left ribs and left wrist, both of which were without acute findings including fracture/pneumothorax.  It was suspected she had a left rib contusion.  She was discharged home later that evening with recommendations for conservative treatment.  Since her ED visit she has been rotating consumption of ibuprofen and Tylenol.  She continues to notice discomfort to her left lateral and anterior rib under the breast, but she's feeling better.   Last night she slept better, but previously she experienced pain and pressure to the site that kept her from sleeping. Sitting and laying for prolonged periods of time cause increased pain. She's been able to complete her typical household chores, yesterday she mowed the grass. She has not resumed physical exercise yet. Her last dose of Advil was at 3 pm yesterday.   Overall she's feeling slightly better. She denies shortness of breath, difficulty breathing.  She has no wrist pain.  Review of Systems  Respiratory:  Negative for cough and shortness of breath.   Musculoskeletal:  Positive for arthralgias.  Skin:  Negative for color change.         Past Medical History:  Diagnosis Date   Anemia    distant past   Anorexia    college   Cervicalgia    Had MRI, seeing neurosurgeon 09/22/15   Community acquired pneumonia 11/28/2018   Depression    [place]    LEFT 2ND    Stiff neck    limited turn to left    Social History   Socioeconomic History   Marital status: Married    Spouse name: Not on file   Number of children: Not on file   Years of education: Not on file   Highest education level: Some college, no degree  Occupational History   Not on file  Tobacco Use   Smoking status: Never   Smokeless tobacco: Never  Substance and Sexual Activity   Alcohol use: Yes    Alcohol/week: 7.0 standard drinks of alcohol    Types: 7 Glasses of wine per week   Drug use: No   Sexual activity: Not on file  Other Topics Concern   Not on file  Social History Narrative   Lives in [place] with husband.    Has 3 children and 1 step son, 43, 73, 29, 94. 1 son in [place], step son in [place]. 2 sons in [place].    No pets.   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    Social Determinants of Health   Financial Resource Strain: Low Risk  (04/20/2023)   Overall Financial Resource Strain (CARDIA)    Difficulty of Paying Living Expenses: Not hard at all  Food Insecurity: No  Food Insecurity (04/20/2023)   Hunger Vital Sign    Worried About Running Out of Food in the Last Year: Never true    Ran Out of Food in the Last Year: Never true  Transportation Needs: No Transportation Needs (04/20/2023)   PRAPARE - Administrator, Civil Service (Medical): No    Lack of Transportation (Non-Medical): No  Physical Activity: Sufficiently Active (04/20/2023)   Exercise Vital Sign    Days of Exercise per Week: 7 days    Minutes of Exercise per Session: 60 min  Stress: No Stress Concern Present (04/20/2023)   [employer] of Occupational Health - Occupational Stress Questionnaire    Feeling of Stress : Only a little  Social Connections: Socially Integrated (04/20/2023)   Social Connection and Isolation Panel [NHANES]    Frequency of  Communication with Friends and Family: More than three times a week    Frequency of Social Gatherings with Friends and Family: Twice a week    Attends Religious Services: More than 4 times per year    Active Member of Clubs or Organizations: Yes    Attends Banker Meetings: 1 to 4 times per year    Marital Status: Married  Catering manager Violence: Not At Risk (11/02/2022)   Humiliation, Afraid, Rape, and Kick questionnaire    Fear of Current or Ex-Partner: No    Emotionally Abused: No    Physically Abused: No    Sexually Abused: No    Past Surgical History:  Procedure Laterality Date   CORRECTION OVERLAPPING TOES Left 09/03/2015   Procedure: CORRECTION OVERLAPPING TOE LEFT SECOND TOE;  Surgeon: Gwyneth Revels, DPM;  Location: [facility];  Service: Podiatry;  Laterality: Left;   HALLUX VALGUS LAPIDUS Left 09/03/2015   Procedure: HALLUX VALGUS LAPIDUS LEFT FOOT;  Surgeon: Gwyneth Revels, DPM;  Location: [facility];  Service: Podiatry;  Laterality: Left;  WITH POPLITEAL 2ND CASE PER SCHEDULING SHEET   HAMMER TOE SURGERY Left 09/03/2015   Procedure: HAMMER TOE CORRECTION LEFT SECOND TOE;  Surgeon: Gwyneth Revels, DPM;  Location: [facility];  Service: Podiatry;  Laterality: Left;   INNER EAR SURGERY     Right ear drum collapsed, Dr. Jenne Campus   SHOULDER SURGERY Right    Rotator cuff repair   TONSILECTOMY/ADENOIDECTOMY WITH MYRINGOTOMY      Family History  Problem Relation Age of Onset   Stroke Mother    Heart disease Mother        arrhythmia   Stroke Father    Arthritis Maternal Grandmother    Heart disease Maternal Grandfather    Breast cancer Other 39    No Known Allergies  Current Outpatient Medications on File Prior to Visit  Medication Sig Dispense Refill   Cholecalciferol (VITAMIN D-3 PO) Take by mouth.     MULTIPLE VITAMINS PO Take by mouth.     meloxicam (MOBIC) 15 MG tablet Take 1 tablet (15 mg total) by mouth daily as needed for  pain. (Patient not taking: Reported on 04/21/2023) 90 tablet 0   No current facility-administered medications on file prior to visit.    BP (!) 148/80   Pulse 66   Temp (!) 97.3 F (36.3 C) (Temporal)   Ht 5\' 3"  (1.6 m)   Wt 116 lb (52.6 kg)   SpO2 100%   BMI 20.55 kg/m  Objective:   Physical Exam Constitutional:      General: She is not in acute distress. Cardiovascular:  Rate and Rhythm: Normal rate.  Pulmonary:     Effort: Pulmonary effort is normal.     Breath sounds: Normal breath sounds. No wheezing or rhonchi.  Musculoskeletal:     Comments: Tenderness to left lateral and anterior ribs just below breast. Mild swelling between 4th and 5th rib anteriorly.   Skin:    Findings: No bruising.           Assessment & Plan:  Rib pain on left side Assessment & Plan: Sequela since mechanical fall 2 weeks ago.  Fortunately, she is improving.  Discussed that rib contusions symptoms can last for weeks. Continue alternating Tylenol and ibuprofen as needed.  Discussed use of Voltaren gel.  The swelling noted on exam was likely from a hematoma that appears to be healing. No evidence of pneumothorax.  Discussed importance of deep breathing exercises.  She will advance slowly into her regular exercise routine.. Return precautions provided.         Doreene Nest, NP

## 2023-05-31 DIAGNOSIS — R69 Illness, unspecified: Secondary | ICD-10-CM | POA: Diagnosis not present

## 2023-07-21 DIAGNOSIS — R69 Illness, unspecified: Secondary | ICD-10-CM | POA: Diagnosis not present

## 2023-09-06 DIAGNOSIS — R69 Illness, unspecified: Secondary | ICD-10-CM | POA: Diagnosis not present

## 2023-09-07 DIAGNOSIS — Z008 Encounter for other general examination: Secondary | ICD-10-CM | POA: Diagnosis not present

## 2023-09-15 DIAGNOSIS — D2272 Melanocytic nevi of left lower limb, including hip: Secondary | ICD-10-CM | POA: Diagnosis not present

## 2023-09-15 DIAGNOSIS — L659 Nonscarring hair loss, unspecified: Secondary | ICD-10-CM | POA: Diagnosis not present

## 2023-09-15 DIAGNOSIS — L820 Inflamed seborrheic keratosis: Secondary | ICD-10-CM | POA: Diagnosis not present

## 2023-09-15 DIAGNOSIS — Z85828 Personal history of other malignant neoplasm of skin: Secondary | ICD-10-CM | POA: Diagnosis not present

## 2023-09-15 DIAGNOSIS — D2261 Melanocytic nevi of right upper limb, including shoulder: Secondary | ICD-10-CM | POA: Diagnosis not present

## 2023-09-15 DIAGNOSIS — L538 Other specified erythematous conditions: Secondary | ICD-10-CM | POA: Diagnosis not present

## 2023-09-15 DIAGNOSIS — D225 Melanocytic nevi of trunk: Secondary | ICD-10-CM | POA: Diagnosis not present

## 2023-09-15 DIAGNOSIS — L570 Actinic keratosis: Secondary | ICD-10-CM | POA: Diagnosis not present

## 2023-09-15 DIAGNOSIS — D2262 Melanocytic nevi of left upper limb, including shoulder: Secondary | ICD-10-CM | POA: Diagnosis not present

## 2023-09-15 DIAGNOSIS — L2989 Other pruritus: Secondary | ICD-10-CM | POA: Diagnosis not present

## 2023-10-06 ENCOUNTER — Ambulatory Visit: Payer: Medicare HMO

## 2023-10-06 VITALS — Ht 63.0 in | Wt 113.0 lb

## 2023-10-06 DIAGNOSIS — Z0000 Encounter for general adult medical examination without abnormal findings: Secondary | ICD-10-CM

## 2023-10-06 NOTE — Progress Notes (Signed)
Subjective:   Robyn House is a 72 y.o. female who presents for Medicare Annual (Subsequent) preventive examination.  Visit Complete: Virtual I connected with  [name] on 10/06/23 by a audio enabled telemedicine application and verified that I am speaking with the correct person using two identifiers.  Patient Location: Home  Provider Location: Office/Clinic  I discussed the limitations of evaluation and management by telemedicine. The patient expressed understanding and agreed to proceed.  Vital Signs: Because this visit was a virtual/telehealth visit, some criteria may be missing or patient reported. Any vitals not documented were not able to be obtained and vitals that have been documented are patient reported.  Cardiac Risk Factors include: advanced age (>66men, >21 women);family history of premature cardiovascular disease     Objective:    Today's Vitals   10/06/23 1344 10/06/23 1345  Weight: 113 lb (51.3 kg)   Height: 5\' 3"  (1.6 m)   PainSc: 6  6   PainLoc: Foot    Body mass index is 20.02 kg/m.     10/06/2023    1:48 PM 04/06/2023    8:36 PM 11/02/2022    1:33 PM 10/06/2021   11:19 AM 10/03/2019   12:02 PM  Advanced Directives  Does Patient Have a Medical Advance Directive? Yes No Yes Yes No  Type of Estate agent of [place];Living will  Healthcare Power of [place];Living will Healthcare Power of [place];Living will   Does patient want to make changes to medical advance directive?    Yes (MAU/Ambulatory/Procedural Areas - Information given)   Copy of Healthcare Power of Attorney in Chart? No - copy requested  No - copy requested    Would patient like information on creating a medical advance directive?     Yes (MAU/Ambulatory/Procedural Areas - Information given)    Current Medications (verified) Outpatient Encounter Medications as of 10/06/2023  Medication Sig   Cholecalciferol (VITAMIN D-3 PO) Take by mouth.   fenoprofen  (NALFON) 600 MG TABS tablet Take 600 mg by mouth 1 day or 1 dose.   MULTIPLE VITAMINS PO Take by mouth.   meloxicam (MOBIC) 15 MG tablet Take 1 tablet (15 mg total) by mouth daily as needed for pain. (Patient not taking: Reported on 04/21/2023)   No facility-administered encounter medications on file as of 10/06/2023.    Allergies (verified) Patient has no known allergies.   History: Past Medical History:  Diagnosis Date   Anemia    distant past   Anorexia    college   Cervicalgia    Had MRI, seeing neurosurgeon 09/22/15   Community acquired pneumonia 11/28/2018   Depression    [place]    LEFT 2ND   Stiff neck    limited turn to left   Past Surgical History:  Procedure Laterality Date   CORRECTION OVERLAPPING TOES Left 09/03/2015   Procedure: CORRECTION OVERLAPPING TOE LEFT SECOND TOE;  Surgeon: Gwyneth Revels, DPM;  Location: [facility];  Service: Podiatry;  Laterality: Left;   HALLUX VALGUS LAPIDUS Left 09/03/2015   Procedure: HALLUX VALGUS LAPIDUS LEFT FOOT;  Surgeon: Gwyneth Revels, DPM;  Location: [facility];  Service: Podiatry;  Laterality: Left;  WITH POPLITEAL 2ND CASE PER SCHEDULING SHEET   HAMMER TOE SURGERY Left 09/03/2015   Procedure: HAMMER TOE CORRECTION LEFT SECOND TOE;  Surgeon: Gwyneth Revels, DPM;  Location: [facility];  Service: Podiatry;  Laterality: Left;   INNER EAR SURGERY     Right ear drum collapsed, Dr. Jenne Campus  SHOULDER SURGERY Right    Rotator cuff repair   TONSILECTOMY/ADENOIDECTOMY WITH MYRINGOTOMY     Family History  Problem Relation Age of Onset   Stroke Mother    Heart disease Mother        arrhythmia   Stroke Father    Arthritis Maternal Grandmother    Heart disease Maternal Grandfather    Breast cancer Other 68   Social History   Socioeconomic History   Marital status: Married    Spouse name: Not on file   Number of children: Not on file   Years of education: Not on file   Highest education level: Some  college, no degree  Occupational History   Not on file  Tobacco Use   Smoking status: Never   Smokeless tobacco: Never  Substance and Sexual Activity   Alcohol use: Yes    Alcohol/week: 7.0 standard drinks of alcohol    Types: 7 Glasses of wine per week   Drug use: No   Sexual activity: Not on file  Other Topics Concern   Not on file  Social History Narrative   Lives in [place] with husband.    Has 3 children and 1 step son, 35, 75, 56, 56. 1 son in [place], step son in [place]. 2 sons in [place].    No pets.   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    Social Determinants of Health   Financial Resource Strain: Low Risk  (10/06/2023)   Overall Financial Resource Strain (CARDIA)    Difficulty of Paying Living Expenses: Not hard at all  Food Insecurity: No Food Insecurity (10/06/2023)   Hunger Vital Sign    Worried About Running Out of Food in the Last Year: Never true    Ran Out of Food in the Last Year: Never true  Transportation Needs: No Transportation Needs (10/06/2023)   PRAPARE - Administrator, Civil Service (Medical): No    Lack of Transportation (Non-Medical): No  Physical Activity: Sufficiently Active (10/06/2023)   Exercise Vital Sign    Days of Exercise per Week: 7 days    Minutes of Exercise per Session: 150+ min  Stress: No Stress Concern Present (10/06/2023)   [employer] of Occupational Health - Occupational Stress Questionnaire    Feeling of Stress : Only a little  Social Connections: Socially Integrated (10/06/2023)   Social Connection and Isolation Panel [NHANES]    Frequency of Communication with Friends and Family: More than three times a week    Frequency of Social Gatherings with Friends and Family: Twice a week    Attends Religious Services: More than 4 times per year    Active Member of [employer] or  Organizations: Yes    Attends Banker Meetings: 1 to 4 times per year    Marital Status: Married    Tobacco Counseling Counseling given: Not Answered   Clinical Intake:  Pre-visit preparation completed: Yes  Pain : 0-10 Pain Score: 6  Pain Type: Acute pain Pain Location: Foot Pain Orientation: Left Pain Descriptors / Indicators: Aching Pain Onset: 1 to 4 weeks ago Pain Frequency: Intermittent Pain Relieving Factors: otc pain meds  Pain Relieving Factors: otc pain meds  BMI - recorded: 20.02 Nutritional Status: BMI of 19-24  Normal Nutritional Risks: None Diabetes: No  How often do you need to have someone  help you when you read instructions, pamphlets, or other written materials from your doctor or pharmacy?: 1 - Never What is the last grade level you completed in school?: HSG; 1 YR COLLEGE  Interpreter Needed?: No  Information entered by :: Susie Cassette, LPN.   Activities of Daily Living    10/06/2023    1:52 PM 11/02/2022    1:34 PM  In your present state of health, do you have any difficulty performing the following activities:  Hearing? 0 0  Vision? 0 0  Difficulty concentrating or making decisions? 0 0  Walking or climbing stairs? 0 0  Dressing or bathing? 0 0  Doing errands, shopping? 0 0  Preparing Food and eating ? N N  Using the Toilet? N N  In the past six months, have you accidently leaked urine? Y N  Do you have problems with loss of bowel control? N N  Managing your Medications? N N  Managing your Finances? N N  Housekeeping or managing your Housekeeping? N N    Patient Care Team: Doreene Nest, NP as PCP - General (Internal Medicine)  Indicate any recent Medical Services you may have received from other than [facility] providers in the past year (date may be approximate).     Assessment:   This is a routine wellness examination for [place].  Hearing/Vision screen Hearing Screening - Comments:: Denies hearing  difficulties. No hearing aids.  Vision Screening - Comments:: Wears rx glasses for distance - up to date with routine eye exams with Hulen Luster, OD.    Goals Addressed             This Visit's Progress    Patient Stated       My healthcare goal for 2025 is to maintain my current health status by continuing to eat healthy, stay independent, physically and socially active.      Depression Screen    10/06/2023    1:50 PM 11/02/2022    1:31 PM 11/02/2022   11:11 AM 10/30/2021   11:36 AM 10/06/2021   11:25 AM 10/13/2020    9:16 AM 10/03/2019   12:05 PM  PHQ 2/9 Scores  PHQ - 2 Score 0 0 0 1 0 0 0  PHQ- 9 Score 0 0 0 2   0    Fall Risk    10/06/2023    1:49 PM 04/21/2023   10:58 AM 11/02/2022    1:33 PM 11/02/2022   11:11 AM 10/06/2021   11:24 AM  Fall Risk   Falls in the past year? 1 1 0 0 0  Number falls in past yr: 0 0 0 0 0  Injury with Fall? 1 1 0 0 0  Risk for fall due to :  History of fall(s) Impaired vision No Fall Risks No Fall Risks  Risk for fall due to: Comment   readers    Follow up Falls evaluation completed;Education provided Falls evaluation completed Falls prevention discussed Falls evaluation completed Falls prevention discussed    MEDICARE RISK AT HOME: Medicare Risk at Home Any stairs in or around the home?: Yes If so, are there any without handrails?: No Home free of loose throw rugs in walkways, pet beds, electrical cords, etc?: Yes Adequate lighting in your home to reduce risk of falls?: Yes Life alert?: No Use of a cane, walker or w/c?: No Grab bars in the bathroom?: No Shower chair or bench in shower?: Yes Elevated toilet seat or a handicapped toilet?: No  TIMED UP AND GO:  Was the test performed?  No    Cognitive Function:    10/06/2023    1:50 PM 10/03/2019   12:08 PM  MMSE - Mini Mental State Exam  Not completed: Unable to complete   Orientation to time  5  Orientation to Place  5  Registration  3  Attention/ Calculation   5  Recall  3  Language- repeat  1        10/06/2023    1:50 PM 11/02/2022    1:34 PM  6CIT Screen  What Year? 0 points 0 points  What month? 0 points 0 points  What time? 0 points 0 points  Count back from 20 0 points 0 points  Months in reverse 0 points 0 points  Repeat phrase 0 points 0 points  Total Score 0 points 0 points    Immunizations Immunization History  Administered Date(s) Administered   Influenza,inj,Quad PF,6+ Mos 10/25/2019   Influenza-Unspecified 09/08/2020, 09/04/2021   PFIZER(Purple Top)SARS-COV-2 Vaccination 12/11/2019, 01/01/2020   Pneumococcal Conjugate-13 07/15/2016   Pneumococcal Polysaccharide-23 07/21/2017   Tdap 07/11/2015   Zoster Recombinant(Shingrix) 09/27/2018, 12/27/2018    TDAP status: Up to date  Flu Vaccine status: Due, Education has been provided regarding the importance of this vaccine. Advised may receive this vaccine at local pharmacy or Health Dept. Aware to provide a copy of the vaccination record if obtained from local pharmacy or Health Dept. Verbalized acceptance and understanding.  Pneumococcal vaccine status: Up to date  Covid-19 vaccine status: Completed vaccines  Qualifies for Shingles Vaccine? Yes   Zostavax completed No   Shingrix Completed?: Yes  Screening Tests Health Maintenance  Topic Date Due   INFLUENZA VACCINE  06/16/2023   COVID-19 Vaccine (3 - 2023-24 season) 07/17/2023   Medicare Annual Wellness (AWV)  10/05/2024   MAMMOGRAM  03/28/2025   DTaP/Tdap/Td (2 - Td or Tdap) 07/10/2025   Colonoscopy  12/25/2028   Pneumonia Vaccine 66+ Years old  Completed   DEXA SCAN  Completed   Hepatitis C Screening  Completed   Zoster Vaccines- Shingrix  Completed   HPV VACCINES  Aged Out    Health Maintenance  Health Maintenance Due  Topic Date Due   INFLUENZA VACCINE  06/16/2023   COVID-19 Vaccine (3 - 2023-24 season) 07/17/2023    Colorectal cancer screening: Type of screening: Colonoscopy. Completed  12/25/2018. Repeat every 10 years  Mammogram status: Completed 03/29/2023. Repeat every year  Bone Density status: Completed 03/03/2021. Results reflect: Bone density results: OSTEOPENIA. Repeat every 2-3 years.  Lung Cancer Screening: (Low Dose CT Chest recommended if Age 40-80 years, 20 pack-year currently smoking OR have quit w/in 15years.) does not qualify.   Lung Cancer Screening Referral: no  Additional Screening:  Hepatitis C Screening: does qualify; Completed 07/08/2016  Vision Screening: Recommended annual ophthalmology exams for early detection of glaucoma and other disorders of the eye. Is the patient up to date with their annual eye exam?  Yes  Who is the provider or what is the name of the office in which the patient attends annual eye exams? Hulen Luster, OD. If pt is not established with a provider, would they like to be referred to a provider to establish care? No .   Dental Screening: Recommended annual dental exams for proper oral hygiene  Diabetic Foot Exam: N/A  Community Resource Referral / Chronic Care Management: CRR required this visit?  No   CCM required this visit?  No  Plan:     I have personally reviewed and noted the following in the patient's chart:   Medical and social history Use of alcohol, tobacco or illicit drugs  Current medications and supplements including opioid prescriptions. Patient is not currently taking opioid prescriptions. Functional ability and status Nutritional status Physical activity Advanced directives List of other physicians Hospitalizations, surgeries, and ER visits in previous 12 months Vitals Screenings to include cognitive, depression, and falls Referrals and appointments  In addition, I have reviewed and discussed with patient certain preventive protocols, quality metrics, and best practice recommendations. A written personalized care plan for preventive services as well as general preventive health  recommendations were provided to patient.     Mickeal Needy, LPN   [id number]/[id number]   After Visit Summary: (MyChart) Due to this being a telephonic visit, the after visit summary with patients personalized plan was offered to patient via MyChart   Nurse Notes: None

## 2023-10-06 NOTE — Patient Instructions (Signed)
Robyn House , Thank you for taking time to come for your Medicare Wellness Visit. I appreciate your ongoing commitment to your health goals. Please review the following plan we discussed and let me know if I can assist you in the future.   Referrals/Orders/Follow-Ups/Clinician Recommendations: No  This is a list of the screening recommended for you and due dates:  Health Maintenance  Topic Date Due   Flu Shot  06/16/2023   COVID-19 Vaccine (3 - 2023-24 season) 07/17/2023   Medicare Annual Wellness Visit  10/05/2024   Mammogram  03/28/2025   DTaP/Tdap/Td vaccine (2 - Td or Tdap) 07/10/2025   Colon Cancer Screening  12/25/2028   Pneumonia Vaccine  Completed   DEXA scan (bone density measurement)  Completed   Hepatitis C Screening  Completed   Zoster (Shingles) Vaccine  Completed   HPV Vaccine  Aged Out    Advanced directives: (Copy Requested) Please bring a copy of your health care power of attorney and living will to the office to be added to your chart at your convenience.  Next Medicare Annual Wellness Visit scheduled for next year: No

## 2023-11-04 ENCOUNTER — Encounter: Payer: Medicare HMO | Admitting: Primary Care

## 2023-11-11 ENCOUNTER — Ambulatory Visit (INDEPENDENT_AMBULATORY_CARE_PROVIDER_SITE_OTHER): Payer: Medicare HMO | Admitting: Primary Care

## 2023-11-11 ENCOUNTER — Encounter: Payer: Self-pay | Admitting: Primary Care

## 2023-11-11 VITALS — BP 140/60 | HR 52 | Temp 97.7°F | Ht 63.0 in | Wt 114.0 lb

## 2023-11-11 DIAGNOSIS — Z1231 Encounter for screening mammogram for malignant neoplasm of breast: Secondary | ICD-10-CM

## 2023-11-11 DIAGNOSIS — E785 Hyperlipidemia, unspecified: Secondary | ICD-10-CM

## 2023-11-11 DIAGNOSIS — R102 Pelvic and perineal pain: Secondary | ICD-10-CM

## 2023-11-11 DIAGNOSIS — M79673 Pain in unspecified foot: Secondary | ICD-10-CM

## 2023-11-11 DIAGNOSIS — I10 Essential (primary) hypertension: Secondary | ICD-10-CM | POA: Insufficient documentation

## 2023-11-11 DIAGNOSIS — M7062 Trochanteric bursitis, left hip: Secondary | ICD-10-CM | POA: Diagnosis not present

## 2023-11-11 DIAGNOSIS — M8580 Other specified disorders of bone density and structure, unspecified site: Secondary | ICD-10-CM

## 2023-11-11 DIAGNOSIS — Z0000 Encounter for general adult medical examination without abnormal findings: Secondary | ICD-10-CM

## 2023-11-11 DIAGNOSIS — E2839 Other primary ovarian failure: Secondary | ICD-10-CM

## 2023-11-11 DIAGNOSIS — G8929 Other chronic pain: Secondary | ICD-10-CM

## 2023-11-11 DIAGNOSIS — R030 Elevated blood-pressure reading, without diagnosis of hypertension: Secondary | ICD-10-CM | POA: Insufficient documentation

## 2023-11-11 LAB — COMPREHENSIVE METABOLIC PANEL
ALT: 14 U/L (ref 0–35)
AST: 19 U/L (ref 0–37)
Albumin: 4.2 g/dL (ref 3.5–5.2)
Alkaline Phosphatase: 58 U/L (ref 39–117)
BUN: 15 mg/dL (ref 6–23)
CO2: 30 meq/L (ref 19–32)
Calcium: 10 mg/dL (ref 8.4–10.5)
Chloride: 103 meq/L (ref 96–112)
Creatinine, Ser: 0.68 mg/dL (ref 0.40–1.20)
GFR: 86.52 mL/min (ref >60.00)
Glucose, Bld: 71 mg/dL (ref 70–99)
Potassium: 4.1 meq/L (ref 3.5–5.1)
Sodium: 140 meq/L (ref 135–145)
Total Bilirubin: 0.6 mg/dL (ref 0.2–1.2)
Total Protein: 7 g/dL (ref 6.0–8.3)

## 2023-11-11 LAB — CBC
HCT: 38.2 % (ref 36.0–46.0)
Hemoglobin: 12.7 g/dL (ref 12.0–15.0)
MCHC: 33.2 g/dL (ref 30.0–36.0)
MCV: 95.6 fL (ref 78.0–100.0)
Platelets: 301 10*3/uL (ref 150.0–400.0)
RBC: 4 Mil/uL (ref 3.87–5.11)
RDW: 12.5 % (ref 11.5–15.5)
WBC: 4.4 10*3/uL (ref 4.0–10.5)

## 2023-11-11 LAB — LIPID PANEL
Cholesterol: 166 mg/dL (ref 0–200)
HDL: 65.7 mg/dL (ref >39.00)
LDL Cholesterol: 82 mg/dL (ref 0–99)
NonHDL: 100.22
Total CHOL/HDL Ratio: 3
Triglycerides: 93 mg/dL (ref 0.0–149.0)
VLDL: 18.6 mg/dL (ref 0.0–40.0)

## 2023-11-11 LAB — HEMOGLOBIN A1C: Hgb A1c MFr Bld: 5.9 % (ref 4.6–6.5)

## 2023-11-11 NOTE — Assessment & Plan Note (Signed)
Stable.  No concerns today. Continue to monitor.  

## 2023-11-11 NOTE — Assessment & Plan Note (Signed)
Bone density scan due, orders placed.  Continue calcium and vitamin D daily.

## 2023-11-11 NOTE — Assessment & Plan Note (Signed)
Controlled.  Continue stretching and exercise.

## 2023-11-11 NOTE — Assessment & Plan Note (Signed)
Repeat lipid panel pending.   Commended her on regular exercise.

## 2023-11-11 NOTE — Assessment & Plan Note (Signed)
Noted on several prior office visits.  Recommended she begin checking BP at home and report if readings remain at or above 140/90.

## 2023-11-11 NOTE — Assessment & Plan Note (Signed)
Immunizations UTD. Declines influenza vaccine.  Mammogram UTD. Bone density scan due, orders placed Colonoscopy UTD, no further screening needed per patient.  Discussed the importance of a healthy diet and regular exercise in order for weight loss, and to reduce the risk of further co-morbidity.  Exam stable. Labs pending.  Follow up in 1 year for repeat physical.

## 2023-11-11 NOTE — Assessment & Plan Note (Signed)
Stable, continued.   No concerns today. Continue to monitor.

## 2023-11-11 NOTE — Progress Notes (Signed)
Subjective:    Patient ID: Robyn House, female    DOB: Feb 08, 1951, 72 y.o.   MRN: [id number]  HPI  Robyn House is a very pleasant 72 y.o. female who presents today for complete physical and follow up of chronic conditions.  She has been under a lot of family stress for the last several months. She does not check her BP at home. Family history of stroke in both parents.   Immunizations: -Tetanus: Completed in 2016 -Influenza: Declines influenza vaccine.  -Shingles: Completed Shingrix series -Pneumonia: Completed Prevnar 13 in 2017, Pneumovax 23 in 2018  Diet: Fair diet.  Exercise: Exercising daily   Eye exam: Completes annually  Dental exam: Completes semi-annually    Mammogram: May 2024 Bone Density Scan: April 2022  Colonoscopy: Completed in 2020, no further screening needed per patient.  BP Readings from Last 3 Encounters:  11/11/23 (!) 148/66  04/21/23 (!) 148/80  04/06/23 (!) 179/62         Review of Systems  Constitutional:  Negative for unexpected weight change.  HENT:  Negative for rhinorrhea.   Respiratory:  Negative for cough and shortness of breath.   Cardiovascular:  Negative for chest pain.  Gastrointestinal:  Negative for constipation and diarrhea.  Genitourinary:  Negative for difficulty urinating.  Musculoskeletal:  Negative for arthralgias and myalgias.  Skin:  Negative for rash.  Allergic/Immunologic: Negative for environmental allergies.  Neurological:  Negative for dizziness and headaches.  Psychiatric/Behavioral:  The patient is not nervous/anxious.        Increased family stress         Past Medical History:  Diagnosis Date   Anemia    distant past   Anorexia    college   Cervicalgia    Had MRI, seeing neurosurgeon 09/22/15   Community acquired pneumonia 11/28/2018   Depression    Hammertoe    LEFT 2ND   Rib pain on left side 04/21/2023   Stiff neck    limited turn to left   Tear of left supraspinatus tendon  10/30/2021   Toenail avulsion 07/11/2015   Urinary urgency 12/10/2021   Vaginal irritation 12/10/2021    Social History   Socioeconomic History   Marital status: Married    Spouse name: Not on file   Number of children: Not on file   Years of education: Not on file   Highest education level: Some college, no degree  Occupational History   Not on file  Tobacco Use   Smoking status: Never   Smokeless tobacco: Never  Substance and Sexual Activity   Alcohol use: Yes    Alcohol/week: 7.0 standard drinks of alcohol    Types: 7 Glasses of wine per week   Drug use: No   Sexual activity: Not on file  Other Topics Concern   Not on file  Social History Narrative   Lives in [place] with husband.    Has 3 children and 1 step son, 80, 58, 56, 55. 1 son in [place], step son in [place]. 2 sons in [place].    No pets.   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    Social Drivers of Corporate investment banker Strain: Low Risk  (11/10/2023)   Overall Financial Resource Strain (CARDIA)    Difficulty of Paying Living Expenses: Not hard at all  Food Insecurity:  No Food Insecurity (11/10/2023)   Hunger Vital Sign    Worried About Running Out of Food in the Last Year: Never true    Ran Out of Food in the Last Year: Never true  Transportation Needs: No Transportation Needs (11/10/2023)   PRAPARE - Administrator, Civil Service (Medical): No    Lack of Transportation (Non-Medical): No  Physical Activity: Sufficiently Active (11/10/2023)   Exercise Vital Sign    Days of Exercise per Week: 7 days    Minutes of Exercise per Session: 60 min  Stress: Stress Concern Present (11/10/2023)   [employer] of Occupational Health - Occupational Stress Questionnaire    Feeling of Stress : To some extent  Social Connections: Socially Integrated (11/10/2023)    Social Connection and Isolation Panel [NHANES]    Frequency of Communication with Friends and Family: More than three times a week    Frequency of Social Gatherings with Friends and Family: Three times a week    Attends Religious Services: More than 4 times per year    Active Member of Clubs or Organizations: Yes    Attends Banker Meetings: More than 4 times per year    Marital Status: Married  Catering manager Violence: Not At Risk (10/06/2023)   Humiliation, Afraid, Rape, and Kick questionnaire    Fear of Current or Ex-Partner: No    Emotionally Abused: No    Physically Abused: No    Sexually Abused: No    Past Surgical History:  Procedure Laterality Date   CORRECTION OVERLAPPING TOES Left 09/03/2015   Procedure: CORRECTION OVERLAPPING TOE LEFT SECOND TOE;  Surgeon: Gwyneth Revels, DPM;  Location: [facility];  Service: Podiatry;  Laterality: Left;   HALLUX VALGUS LAPIDUS Left 09/03/2015   Procedure: HALLUX VALGUS LAPIDUS LEFT FOOT;  Surgeon: Gwyneth Revels, DPM;  Location: [facility];  Service: Podiatry;  Laterality: Left;  WITH POPLITEAL 2ND CASE PER SCHEDULING SHEET   HAMMER TOE SURGERY Left 09/03/2015   Procedure: HAMMER TOE CORRECTION LEFT SECOND TOE;  Surgeon: Gwyneth Revels, DPM;  Location: [facility];  Service: Podiatry;  Laterality: Left;   INNER EAR SURGERY     Right ear drum collapsed, Dr. Jenne Campus   SHOULDER SURGERY Right    Rotator cuff repair   TONSILECTOMY/ADENOIDECTOMY WITH MYRINGOTOMY      Family History  Problem Relation Age of Onset   Stroke Mother    Heart disease Mother        arrhythmia   Stroke Father    Arthritis Maternal Grandmother    Heart disease Maternal Grandfather    Breast cancer Other 39    No Known Allergies  Current Outpatient Medications on File Prior to Visit  Medication Sig Dispense Refill   calcium-vitamin D (OSCAL WITH D) 500-5 MG-MCG tablet Take 1 tablet by mouth.     MULTIPLE VITAMINS PO  Take by mouth.     No current facility-administered medications on file prior to visit.    BP (!) 148/66   Pulse (!) 52   Temp 97.7 F (36.5 C) (Temporal)   Ht 5\' 3"  (1.6 m)   Wt 114 lb (51.7 kg)   SpO2 97%   BMI 20.19 kg/m  Objective:   Physical Exam HENT:     Right Ear: Tympanic membrane and ear canal normal.     Left Ear: Tympanic membrane and ear canal normal.  Eyes:     Pupils: Pupils are equal, round, and reactive to light.  Cardiovascular:     Rate and Rhythm: Normal rate and regular rhythm.  Pulmonary:     Effort: Pulmonary effort is normal.     Breath sounds: Normal breath sounds.  Abdominal:     General: Bowel sounds are normal.     Palpations: Abdomen is soft.     Tenderness: There is no abdominal tenderness.  Musculoskeletal:        General: Normal range of motion.     Cervical back: Neck supple.  Skin:    General: Skin is warm and dry.  Neurological:     Mental Status: She is alert and oriented to person, place, and time.     Cranial Nerves: No cranial nerve deficit.     Deep Tendon Reflexes:     Reflex Scores:      Patellar reflexes are 2+ on the right side and 2+ on the left side. Psychiatric:        Mood and Affect: Mood normal.           Assessment & Plan:  Routine general medical examination at a health care facility Assessment & Plan: Immunizations UTD. Declines influenza vaccine.  Mammogram UTD. Bone density scan due, orders placed Colonoscopy UTD, no further screening needed per patient.  Discussed the importance of a healthy diet and regular exercise in order for weight loss, and to reduce the risk of further co-morbidity.  Exam stable. Labs pending.  Follow up in 1 year for repeat physical.    Osteopenia, unspecified location Assessment & Plan: Bone density scan due, orders placed.  Continue calcium and vitamin D daily.  Orders: -     DG Bone Density; Future  Hyperlipidemia, unspecified hyperlipidemia type Assessment  & Plan: Repeat lipid panel pending. Commended her on regular exercise.  Orders: -     Lipid panel -     Hemoglobin A1c -     Comprehensive metabolic panel -     CBC  Trochanteric bursitis of left hip Assessment & Plan: Controlled.  Continue stretching and exercise.    Chronic foot pain, unspecified laterality Assessment & Plan: Stable.  No concerns today. Continue to monitor.    Pelvic pressure in female Assessment & Plan: Stable, continued.   No concerns today. Continue to monitor.    Elevated blood pressure reading in office without diagnosis of hypertension Assessment & Plan: Noted on several prior office visits.  Recommended she begin checking BP at home and report if readings remain at or above 140/90.    Estrogen deficiency -     DG Bone Density; Future  Screening mammogram for breast cancer -     3D Screening Mammogram, Left and Right; Future        Doreene Nest, NP

## 2023-11-11 NOTE — Patient Instructions (Signed)
Stop by the lab prior to leaving today. I will notify you of your results once received.   Start monitoring your blood pressure daily, around the same time of day, for the next 2-3 weeks.  Ensure that you have rested for 30 minutes prior to checking your blood pressure.   Record your readings and notify me if you see numbers consistently at or above 140 on top and/or 90 on bottom.  It was a pleasure to see you today!

## 2024-01-06 ENCOUNTER — Telehealth (INDEPENDENT_AMBULATORY_CARE_PROVIDER_SITE_OTHER): Payer: Medicare HMO | Admitting: Primary Care

## 2024-01-06 ENCOUNTER — Encounter: Payer: Self-pay | Admitting: Primary Care

## 2024-01-06 VITALS — BP 168/68 | Ht 63.0 in | Wt 114.0 lb

## 2024-01-06 DIAGNOSIS — I10 Essential (primary) hypertension: Secondary | ICD-10-CM | POA: Diagnosis not present

## 2024-01-06 MED ORDER — VALSARTAN 80 MG PO TABS
80.0000 mg | ORAL_TABLET | Freq: Every day | ORAL | 0 refills | Status: DC
Start: 2024-01-06 — End: 2024-03-30

## 2024-01-06 NOTE — Patient Instructions (Signed)
Start valsartan 80 mg once daily for blood pressure.  Please schedule a follow up visit to meet back with me in 2-3 weeks for blood pressure check.   It was a pleasure to see you today!

## 2024-01-06 NOTE — Assessment & Plan Note (Signed)
Uncontrolled during numerous prior office visits, also with home readings. Likely hereditary.  Start valsartan 80 mg daily.  We will plan to see her back in the office in 2 to 3 weeks for blood pressure follow-up and BMP.

## 2024-01-06 NOTE — Progress Notes (Signed)
Patient ID: Robyn House, female    DOB: 1951/08/16, 73 y.o.   MRN: [id number]  Virtual visit completed through caregility, a video enabled telemedicine application. Due to national recommendations of social distancing due to COVID-19, a virtual visit is felt to be most appropriate for this patient at this time. Reviewed limitations, risks, security and privacy concerns of performing a virtual visit and the availability of in person appointments. I also reviewed that there may be a patient responsible charge related to this service. The patient agreed to proceed.   Patient location: home Provider location: [place] at [facility], office Persons participating in this virtual visit: patient, provider   If any vitals were documented, they were collected by patient at home unless specified below.    BP (!) 168/68 Comment: Per patient  Ht 5\' 3"  (1.6 m) Comment: per chart  Wt 114 lb (51.7 kg) Comment: Per Chart  BMI 20.19 kg/m    CC: Elevated BP readings Subjective:   HPI: Robyn House is a 73 y.o. female with a history of hyperlipidemia, elevated blood pressure readings without diagnosis of hypertension presenting on 01/06/2024 for Hypertension  Blood pressure historically above goal during numerous prior office visits.  She contacted [place] via MyChart last week with reports of elevated home blood pressure readings.  She is here to discuss today.  Her BP readings at home are running 167/68, 152/69, 154/70, 146/67, 132/62, 127/63. Readings are mostly in the 140-160/60 range. She has a family history of stroke in both parents and heart disease in her mother. She continues to feel stress at home.   She denies chest pain, dizziness, headaches, visual changes. She is exercising daily. Also doing yoga and meditation.   BP Readings from Last 3 Encounters:  01/06/24 (!) 168/68  11/11/23 (!) 140/60  04/21/23 (!) 148/80         Relevant past medical, surgical, family and social history  reviewed and updated as indicated. Interim medical history since our last visit reviewed. Allergies and medications reviewed and updated. Outpatient Medications Prior to Visit  Medication Sig Dispense Refill   calcium-vitamin D (OSCAL WITH D) 500-5 MG-MCG tablet Take 1 tablet by mouth.     MULTIPLE VITAMINS PO Take by mouth.     No facility-administered medications prior to visit.     Per HPI unless specifically indicated in ROS section below Review of Systems  Constitutional:  Negative for fatigue.  Eyes:  Negative for visual disturbance.  Cardiovascular:  Negative for chest pain.  Neurological:  Negative for dizziness and headaches.   Objective:  BP (!) 168/68 Comment: Per patient  Ht 5\' 3"  (1.6 m) Comment: per chart  Wt 114 lb (51.7 kg) Comment: Per Chart  BMI 20.19 kg/m   Wt Readings from Last 3 Encounters:  01/06/24 114 lb (51.7 kg)  11/11/23 114 lb (51.7 kg)  10/06/23 113 lb (51.3 kg)       Physical exam: General: Alert and oriented x 3, no distress, does not appear sickly  Pulmonary: Speaks in complete sentences without increased work of breathing, no cough during visit.  Psychiatric: Normal mood, thought content, and behavior.     Results for orders placed or performed in visit on 11/11/23  Lipid panel   Collection Time: 11/11/23 10:04 AM  Result Value Ref Range   Cholesterol 166 0 - 200 mg/dL   Triglycerides 40.9 0.0 - 149.0 mg/dL   HDL 81.19 >14.78 mg/dL   VLDL 29.5 0.0 - 40.0  mg/dL   LDL Cholesterol 82 0 - 99 mg/dL   Total CHOL/HDL Ratio 3    NonHDL 100.22   Hemoglobin A1c   Collection Time: 11/11/23 10:04 AM  Result Value Ref Range   Hgb A1c MFr Bld 5.9 4.6 - 6.5 %  Comprehensive metabolic panel   Collection Time: 11/11/23 10:04 AM  Result Value Ref Range   Sodium 140 135 - 145 mEq/L   Potassium 4.1 3.5 - 5.1 mEq/L   Chloride 103 96 - 112 mEq/L   CO2 30 19 - 32 mEq/L   Glucose, Bld 71 70 - 99 mg/dL   BUN 15 6 - 23 mg/dL   Creatinine, Ser 7.84  0.40 - 1.20 mg/dL   Total Bilirubin 0.6 0.2 - 1.2 mg/dL   Alkaline Phosphatase 58 39 - 117 U/L   AST 19 0 - 37 U/L   ALT 14 0 - 35 U/L   Total Protein 7.0 6.0 - 8.3 g/dL   Albumin 4.2 3.5 - 5.2 g/dL   GFR 69.62 >95.28 mL/min   Calcium 10.0 8.4 - 10.5 mg/dL  CBC   Collection Time: 11/11/23 10:04 AM  Result Value Ref Range   WBC 4.4 4.0 - 10.5 K/uL   RBC 4.00 3.87 - 5.11 Mil/uL   Platelets 301.0 150.0 - 400.0 K/uL   Hemoglobin 12.7 12.0 - 15.0 g/dL   HCT 41.3 24.4 - 01.0 %   MCV 95.6 78.0 - 100.0 fl   MCHC 33.2 30.0 - 36.0 g/dL   RDW 27.2 53.6 - 64.4 %   Assessment & Plan:   Problem List Items Addressed This Visit       Cardiovascular and Mediastinum   Primary hypertension - Primary   Uncontrolled during numerous prior office visits, also with home readings. Likely hereditary.  Start valsartan 80 mg daily.  We will plan to see her back in the office in 2 to 3 weeks for blood pressure follow-up and BMP.      Relevant Medications   valsartan (DIOVAN) 80 MG tablet     Meds ordered this encounter  Medications   valsartan (DIOVAN) 80 MG tablet    Sig: Take 1 tablet (80 mg total) by mouth daily. for blood pressure.    Dispense:  90 tablet    Refill:  0    Supervising Provider:   BEDSOLE, AMY E [[id number]]   No orders of the defined types were placed in this encounter.   I discussed the assessment and treatment plan with the patient. The patient was provided an opportunity to ask questions and all were answered. The patient agreed with the plan and demonstrated an understanding of the instructions. The patient was advised to call back or seek an in-person evaluation if the symptoms worsen or if the condition fails to improve as anticipated.  Follow up plan:  Start valsartan 80 mg once daily for blood pressure.   Please schedule a follow up visit to meet back with me in 2-3 weeks for blood pressure check.   It was a pleasure to see you today!   Doreene Nest, NP

## 2024-01-31 ENCOUNTER — Encounter: Payer: Self-pay | Admitting: Primary Care

## 2024-01-31 ENCOUNTER — Ambulatory Visit (INDEPENDENT_AMBULATORY_CARE_PROVIDER_SITE_OTHER): Payer: Medicare HMO | Admitting: Primary Care

## 2024-01-31 VITALS — BP 110/58 | HR 58 | Temp 97.2°F | Ht 63.0 in | Wt 116.0 lb

## 2024-01-31 DIAGNOSIS — I10 Essential (primary) hypertension: Secondary | ICD-10-CM | POA: Diagnosis not present

## 2024-01-31 LAB — BASIC METABOLIC PANEL
BUN: 18 mg/dL (ref 6–23)
CO2: 26 meq/L (ref 19–32)
Calcium: 9.8 mg/dL (ref 8.4–10.5)
Chloride: 102 meq/L (ref 96–112)
Creatinine, Ser: 0.75 mg/dL (ref 0.40–1.20)
GFR: 78.97 mL/min (ref >60.00)
Glucose, Bld: 89 mg/dL (ref 70–99)
Potassium: 4.1 meq/L (ref 3.5–5.1)
Sodium: 137 meq/L (ref 135–145)

## 2024-01-31 NOTE — Patient Instructions (Signed)
 Continue valsartan 80 mg daily for blood pressure.  Stop by the lab prior to leaving today. I will notify you of your results once received.   It was a pleasure to see you today!

## 2024-01-31 NOTE — Progress Notes (Signed)
 Subjective:    Patient ID: Robyn House, female    DOB: 1950/12/13, 73 y.o.   MRN: [id number]  HPI  Robyn House is a very pleasant 73 y.o. female with a history of hypertension, hyperlipidemia who presents today for follow-up of hypertension.  She was last evaluated on 01/06/2024 via telemedicine for ongoing elevated blood pressure readings.  During this visit home blood pressure readings were running mostly 140-160/60.  Historically, multiple elevated blood pressure readings during office visits.  Given this information she was initiated on valsartan 80 mg daily.  She is here for follow-up today.  Since her last visit she is compliant to valsartan 80 mg daily. She is checking BP at home which is running 142/91, 137/61, 118/54, 137/59, 125/61, 142/64. She denies dizziness but has noticed fatigue which is new. She denies headaches.   BP Readings from Last 3 Encounters:  01/31/24 (!) 110/58  01/06/24 (!) 168/68  11/11/23 (!) 140/60      Review of Systems  Constitutional:  Positive for fatigue.  Respiratory:  Negative for shortness of breath.   Cardiovascular:  Negative for chest pain.  Neurological:  Negative for dizziness and headaches.         Past Medical History:  Diagnosis Date   Anemia    distant past   Anorexia    college   Cervicalgia    Had MRI, seeing neurosurgeon 09/22/15   Community acquired pneumonia 11/28/2018   Depression    Hammertoe    LEFT 2ND   Rib pain on left side 04/21/2023   Stiff neck    limited turn to left   Tear of left supraspinatus tendon 10/30/2021   Toenail avulsion 07/11/2015   Urinary urgency 12/10/2021   Vaginal irritation 12/10/2021    Social History   Socioeconomic History   Marital status: Married    Spouse name: Not on file   Number of children: Not on file   Years of education: Not on file   Highest education level: Some college, no degree  Occupational History   Not on file  Tobacco Use   Smoking status: Never    Smokeless tobacco: Never  Substance and Sexual Activity   Alcohol use: Yes    Alcohol/week: 7.0 standard drinks of alcohol    Types: 7 Glasses of wine per week   Drug use: No   Sexual activity: Not on file  Other Topics Concern   Not on file  Social History Narrative   Lives in [place] with husband.    Has 3 children and 1 step son, 38, 25, 75, 49. 1 son in [place], step son in [place]. 2 sons in [place].    No pets.   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    Social Drivers of Corporate investment banker Strain: Low Risk  (11/10/2023)   Overall Financial Resource Strain (CARDIA)    Difficulty of Paying Living Expenses: Not hard at all  Food Insecurity: No Food Insecurity (11/10/2023)   Hunger Vital Sign    Worried About Running Out of Food in the Last Year: Never true    Ran Out of Food in the Last Year: Never true  Transportation Needs: No Transportation Needs (11/10/2023)   PRAPARE - Administrator, Civil Service (Medical): No    Lack of Transportation (Non-Medical): No  Physical Activity: Sufficiently Active (11/10/2023)   Exercise Vital Sign    Days of Exercise per Week: 7 days    Minutes of Exercise per Session: 60 min  Stress: Stress Concern Present (11/10/2023)   [employer] of Occupational Health - Occupational Stress Questionnaire    Feeling of Stress : To some extent  Social Connections: Socially Integrated (11/10/2023)   Social Connection and Isolation Panel [NHANES]    Frequency of Communication with Friends and Family: More than three times a week    Frequency of Social Gatherings with Friends and Family: Three times a week    Attends Religious Services: More than 4 times per year    Active Member of Clubs or Organizations: Yes    Attends Banker Meetings: More than 4 times per year    Marital Status:  Married  Catering manager Violence: Not At Risk (10/06/2023)   Humiliation, Afraid, Rape, and Kick questionnaire    Fear of Current or Ex-Partner: No    Emotionally Abused: No    Physically Abused: No    Sexually Abused: No    Past Surgical History:  Procedure Laterality Date   CORRECTION OVERLAPPING TOES Left 09/03/2015   Procedure: CORRECTION OVERLAPPING TOE LEFT SECOND TOE;  Surgeon: Gwyneth Revels, DPM;  Location: [facility];  Service: Podiatry;  Laterality: Left;   HALLUX VALGUS LAPIDUS Left 09/03/2015   Procedure: HALLUX VALGUS LAPIDUS LEFT FOOT;  Surgeon: Gwyneth Revels, DPM;  Location: [facility];  Service: Podiatry;  Laterality: Left;  WITH POPLITEAL 2ND CASE PER SCHEDULING SHEET   HAMMER TOE SURGERY Left 09/03/2015   Procedure: HAMMER TOE CORRECTION LEFT SECOND TOE;  Surgeon: Gwyneth Revels, DPM;  Location: [facility];  Service: Podiatry;  Laterality: Left;   INNER EAR SURGERY     Right ear drum collapsed, Dr. Jenne Campus   SHOULDER SURGERY Right    Rotator cuff repair   TONSILECTOMY/ADENOIDECTOMY WITH MYRINGOTOMY      Family History  Problem Relation Age of Onset   Stroke Mother    Heart disease Mother        arrhythmia   Stroke Father    Arthritis Maternal Grandmother    Heart disease Maternal Grandfather    Breast cancer Other 39    No Known Allergies  Current Outpatient Medications on File Prior to Visit  Medication Sig Dispense Refill   calcium-vitamin D (OSCAL WITH D) 500-5 MG-MCG tablet Take 1 tablet by mouth.     MULTIPLE VITAMINS PO Take by mouth.     valsartan (DIOVAN) 80 MG tablet Take 1 tablet (80 mg total) by mouth daily. for blood pressure. 90 tablet 0   No current facility-administered medications on file prior to visit.    BP (!) 110/58   Pulse (!) 58   Temp (!) 97.2 F (36.2 C) (Temporal)   Ht 5\' 3"  (1.6 m)   Wt 116 lb (52.6 kg)   SpO2 100%   BMI 20.55 kg/m  Objective:   Physical Exam Cardiovascular:     Rate  and Rhythm: Normal rate and regular rhythm.  Pulmonary:     Effort: Pulmonary effort is normal.     Breath sounds: Normal breath sounds.  Musculoskeletal:     Cervical back: Neck supple.  Skin:    General: Skin is warm and dry.  Neurological:     Mental Status: She is alert and oriented to person, place, and time.  Psychiatric:        Mood and  Affect: Mood normal.           Assessment & Plan:  Primary hypertension Assessment & Plan: Well controlled and improved, but is having side effects of fatigue.   We discussed switching to another agent such as amlodipine. She declines and would like to continue valsartan for another few weeks. She will update. Continue Valsartan 80 mg daily.  BMP pending.  Orders: -     Basic metabolic panel        Doreene Nest, NP

## 2024-01-31 NOTE — Assessment & Plan Note (Addendum)
 Well controlled and improved, but is having side effects of fatigue.   We discussed switching to another agent such as amlodipine. She declines and would like to continue valsartan for another few weeks. She will update. Continue Valsartan 80 mg daily.  BMP pending.

## 2024-02-08 DIAGNOSIS — M7062 Trochanteric bursitis, left hip: Secondary | ICD-10-CM | POA: Diagnosis not present

## 2024-03-05 DIAGNOSIS — H0014 Chalazion left upper eyelid: Secondary | ICD-10-CM | POA: Diagnosis not present

## 2024-03-08 DIAGNOSIS — L658 Other specified nonscarring hair loss: Secondary | ICD-10-CM | POA: Diagnosis not present

## 2024-03-08 DIAGNOSIS — L6612 Frontal fibrosing alopecia: Secondary | ICD-10-CM | POA: Diagnosis not present

## 2024-03-12 DIAGNOSIS — R32 Unspecified urinary incontinence: Secondary | ICD-10-CM | POA: Diagnosis not present

## 2024-03-12 DIAGNOSIS — Z8249 Family history of ischemic heart disease and other diseases of the circulatory system: Secondary | ICD-10-CM | POA: Diagnosis not present

## 2024-03-12 DIAGNOSIS — I10 Essential (primary) hypertension: Secondary | ICD-10-CM | POA: Diagnosis not present

## 2024-03-12 DIAGNOSIS — Z823 Family history of stroke: Secondary | ICD-10-CM | POA: Diagnosis not present

## 2024-03-12 DIAGNOSIS — Z9181 History of falling: Secondary | ICD-10-CM | POA: Diagnosis not present

## 2024-03-12 DIAGNOSIS — E785 Hyperlipidemia, unspecified: Secondary | ICD-10-CM | POA: Diagnosis not present

## 2024-03-30 ENCOUNTER — Other Ambulatory Visit: Payer: Self-pay | Admitting: Primary Care

## 2024-03-30 DIAGNOSIS — I10 Essential (primary) hypertension: Secondary | ICD-10-CM

## 2024-03-30 NOTE — Telephone Encounter (Unsigned)
 Copied from CRM [id number]. Topic: Clinical - Medication Refill >> Mar 30, 2024 11:04 AM [place] J wrote: Medication: valsartan  (DIOVAN ) 80 MG tablet  Has the patient contacted their pharmacy? No (Agent: If no, request that the patient contact the pharmacy for the refill. If patient does not wish to contact the pharmacy document the reason why and proceed with request.) (Agent: If yes, when and what did the pharmacy advise?) No refills available through pharmacy.  This is the patient's preferred pharmacy:   [facility] - [place], [place] - [address] [place] [place] [address] Phone: [phone] Fax: [phone]  Is this the correct pharmacy for this prescription? Yes If no, delete pharmacy and type the correct one.   Has the prescription been filled recently? No  Is the patient out of the medication? No  Has the patient been seen for an appointment in the last year OR does the patient have an upcoming appointment? Yes  Can we respond through MyChart? Yes  Agent: Please be advised that Rx refills may take up to 3 business days. We ask that you follow-up with your pharmacy.

## 2024-04-02 MED ORDER — VALSARTAN 80 MG PO TABS: 80.0000 mg | ORAL_TABLET | Freq: Every day | ORAL | 1 refills | Status: DC

## 2024-04-10 DIAGNOSIS — H0014 Chalazion left upper eyelid: Secondary | ICD-10-CM | POA: Diagnosis not present

## 2024-04-16 ENCOUNTER — Ambulatory Visit
Admission: RE | Admit: 2024-04-16 | Discharge: 2024-04-16 | Disposition: A | Discharge status: Home / Self Care | Source: Ambulatory Visit | Attending: Primary Care | Admitting: Primary Care

## 2024-04-16 DIAGNOSIS — Z1231 Encounter for screening mammogram for malignant neoplasm of breast: Secondary | ICD-10-CM

## 2024-04-16 DIAGNOSIS — M8588 Other specified disorders of bone density and structure, other site: Secondary | ICD-10-CM | POA: Diagnosis not present

## 2024-04-16 DIAGNOSIS — M8580 Other specified disorders of bone density and structure, unspecified site: Secondary | ICD-10-CM | POA: Insufficient documentation

## 2024-04-16 DIAGNOSIS — E2839 Other primary ovarian failure: Secondary | ICD-10-CM | POA: Insufficient documentation

## 2024-04-16 DIAGNOSIS — Z780 Asymptomatic menopausal state: Secondary | ICD-10-CM | POA: Diagnosis not present

## 2024-04-17 ENCOUNTER — Ambulatory Visit: Payer: Self-pay | Admitting: Primary Care

## 2024-05-16 DIAGNOSIS — H0014 Chalazion left upper eyelid: Secondary | ICD-10-CM | POA: Diagnosis not present

## 2024-07-03 ENCOUNTER — Other Ambulatory Visit: Payer: Self-pay | Admitting: Primary Care

## 2024-07-03 DIAGNOSIS — I10 Essential (primary) hypertension: Secondary | ICD-10-CM

## 2024-08-29 ENCOUNTER — Ambulatory Visit: Payer: Self-pay | Admitting: General Practice

## 2024-08-29 ENCOUNTER — Encounter: Payer: Self-pay | Admitting: General Practice

## 2024-08-29 ENCOUNTER — Ambulatory Visit: Payer: Self-pay

## 2024-08-29 ENCOUNTER — Ambulatory Visit (INDEPENDENT_AMBULATORY_CARE_PROVIDER_SITE_OTHER): Admitting: General Practice

## 2024-08-29 VITALS — BP 118/58 | HR 72 | Temp 98.0°F | Ht 63.0 in | Wt 113.0 lb

## 2024-08-29 DIAGNOSIS — J069 Acute upper respiratory infection, unspecified: Secondary | ICD-10-CM | POA: Diagnosis not present

## 2024-08-29 DIAGNOSIS — R051 Acute cough: Secondary | ICD-10-CM | POA: Diagnosis not present

## 2024-08-29 LAB — POC COVID19 BINAXNOW: SARS Coronavirus 2 Ag: NEGATIVE

## 2024-08-29 LAB — POCT INFLUENZA A/B
Influenza A, POC: NEGATIVE
Influenza B, POC: NEGATIVE

## 2024-08-29 MED ORDER — PREDNISONE 20 MG PO TABS
40.0000 mg | ORAL_TABLET | Freq: Every day | ORAL | 0 refills | Status: AC
Start: 2024-08-29 — End: 2024-09-03

## 2024-08-29 MED ORDER — BENZONATATE 200 MG PO CAPS: 200.0000 mg | ORAL_CAPSULE | Freq: Three times a day (TID) | ORAL | 0 refills | Status: DC | PRN

## 2024-08-29 NOTE — Telephone Encounter (Signed)
 FYI Only or Action Required?: FYI only for provider.  Patient was last seen in primary care on 01/31/2024 by Gretta Comer POUR, NP.  Called Nurse Triage reporting Cough.  Symptoms began several days ago.  Interventions attempted: OTC medications: Dayquil and Nyquil.  Symptoms are: gradually worsening.  Triage Disposition: See Physician Within 24 Hours  Patient/caregiver understands and will follow disposition?: Yes       Copied from CRM #[id number]. Topic: Clinical - Red Word Triage >> Aug 29, 2024  8:26 AM Suzen RAMAN wrote: Red Word that prompted transfer to Nurse Triage:  low grade fever,congestion,cold chill at night, fever 100.1 and productive cough. Requesting an appt Reason for Disposition  Fever present > 3 days (72 hours)  Answer Assessment - Initial Assessment Questions Currently using Nyquil and dayquil for symptoms.   1. ONSET: When did the cough begin?      Sunday night  2. SEVERITY: How bad is the cough today?      Moderate  3. SPUTUM: Describe the color of your sputum (e.g., none, dry cough; clear, white, yellow, green)     Yellow in color  4. HEMOPTYSIS: Are you coughing up any blood? If Yes, ask: How much? (e.g., flecks, streaks, tablespoons, etc.)     Denies  5. DIFFICULTY BREATHING: Are you having difficulty breathing? If Yes, ask: How bad is it? (e.g., mild, moderate, severe)      Denies  6. FEVER: Do you have a fever? If Yes, ask: What is your temperature, how was it measured, and when did it start?     10 1 last night and 100.1  7. CARDIAC HISTORY: Do you have any history of heart disease? (e.g., heart attack, congestive heart failure)      Denies  8. LUNG HISTORY: Do you have any history of lung disease?  (e.g., pulmonary embolus, asthma, emphysema)     Denies  9. PE RISK FACTORS: Do you have a history of blood clots? (or: recent major surgery, recent prolonged travel, bedridden)     Denies  10. OTHER SYMPTOMS: Do you have any  other symptoms? (e.g., runny nose, wheezing, chest pain)       Chills, congestion, low grade fever, and fatigue  Protocols used: Cough - Acute Productive-A-AH

## 2024-08-29 NOTE — Progress Notes (Signed)
 Established Patient Office Visit  Subjective   Patient ID: Robyn House, female    DOB: [birth year]  Age: 73 y.o. MRN: [id number]  Chief Complaint  Patient presents with   Cough    Has been productive with yellow mucus, cold chills, body aches, fever, slight headaches, tightness in neck and shoulders, fatigue, loss of appetite since Sunday. Patient has been taking dayquil and nyquil.     HPI  Robyn House is a 73 year old female, patient of Mallie Gaskins, NP, with past medical history of HTN, osteopenia, HLD presents today for an acute visit to discuss flu-like symptoms.   Discussed the use of AI scribe software for clinical note transcription with the patient, who gave verbal consent to proceed.  History of Present Illness Robyn House is a 73 year old female who presents with cough, fever, and fatigue.  Symptoms began on Sunday with a productive cough yielding yellow mucus, chills, body aches, slight headaches, and a fever peaking at 1075F. She experiences general malaise, fatigue, and anorexia.  Her last fever was this morning, measuring 100.75F, managed with DayQuil. She has a slight sore throat and wheezing, particularly in the last day. She is trying to maintain fluid intake despite decreased appetite.  No ear pain, sinus pressure, blurred vision, dizziness, nausea, vomiting, or diarrhea. DayQuil has been effective in reducing the fever.  Her husband has experienced night sweats and chills for the past few weeks, starting during a trip to [facility]. Despite various tests, including blood tests and urinalysis, no definitive cause has been identified. He is reportedly feeling better now.  She has not traveled recently except for a trip to [place]  to visit her granddaughter, which was by car.     Patient Active Problem List   Diagnosis Date Noted   Primary hypertension 11/11/2023   Chronic shoulder pain 11/02/2022   Pelvic pressure in female 12/10/2021    Hyperlipidemia 10/30/2021   Trochanteric bursitis 12/05/2017   Medicare annual wellness visit, initial 07/15/2016   Chronic foot pain 08/28/2015   Cervicalgia 05/01/2015   Osteopenia 09/28/2013   Routine general medical examination at a health care facility 06/13/2013   Postmenopausal estrogen deficiency 06/13/2013   Past Medical History:  Diagnosis Date   Anemia    distant past   Anorexia    college   Cervicalgia    Had MRI, seeing neurosurgeon 09/22/15   Community acquired pneumonia 11/28/2018   Depression    Hammertoe    LEFT 2ND   Rib pain on left side 04/21/2023   Stiff neck    limited turn to left   Tear of left supraspinatus tendon 10/30/2021   Toenail avulsion 07/11/2015   Urinary urgency 12/10/2021   Vaginal irritation 12/10/2021   Past Surgical History:  Procedure Laterality Date   CORRECTION OVERLAPPING TOES Left 09/03/2015   Procedure: CORRECTION OVERLAPPING TOE LEFT SECOND TOE;  Surgeon: Eva Gay, DPM;  Location: [facility];  Service: Podiatry;  Laterality: Left;   HALLUX VALGUS LAPIDUS Left 09/03/2015   Procedure: HALLUX VALGUS LAPIDUS LEFT FOOT;  Surgeon: Eva Gay, DPM;  Location: [facility];  Service: Podiatry;  Laterality: Left;  WITH POPLITEAL 2ND CASE PER SCHEDULING SHEET   HAMMER TOE SURGERY Left 09/03/2015   Procedure: HAMMER TOE CORRECTION LEFT SECOND TOE;  Surgeon: Eva Gay, DPM;  Location: [facility];  Service: Podiatry;  Laterality: Left;   INNER EAR SURGERY     Right ear drum collapsed, Dr.  McQueen   SHOULDER SURGERY Right    Rotator cuff repair   TONSILECTOMY/ADENOIDECTOMY WITH MYRINGOTOMY     No Known Allergies       08/29/2024   10:26 AM 01/31/2024   11:56 AM 01/06/2024    9:00 AM  Depression screen PHQ 2/9  Decreased Interest 0 0 0  Down, Depressed, Hopeless 0 1 1  PHQ - 2 Score 0 1 1  Altered sleeping 1 1 1   Tired, decreased energy 1 1 0  Change in appetite 1 0 1  Feeling bad or failure  about yourself  0 0 0  Trouble concentrating 0 0 0  Moving slowly or fidgety/restless 0 0 0  Suicidal thoughts 0 0 0  PHQ-9 Score 3 3 3   Difficult doing work/chores Somewhat difficult Not difficult at all Not difficult at all       08/29/2024   10:26 AM  GAD 7 : Generalized Anxiety Score  Nervous, Anxious, on Edge 0  Control/stop worrying 0  Worry too much - different things 0  Trouble relaxing 0  Restless 0  Easily annoyed or irritable 0  Afraid - awful might happen 0  Total GAD 7 Score 0  Anxiety Difficulty Not difficult at all      Review of Systems  Constitutional:  Positive for chills, fever and malaise/fatigue.  HENT:  Positive for congestion. Negative for ear pain, sinus pain and sore throat.   Eyes:  Negative for blurred vision.  Respiratory:  Positive for cough and sputum production. Negative for shortness of breath.   Cardiovascular:  Negative for chest pain.  Gastrointestinal:  Negative for abdominal pain, constipation, diarrhea, heartburn, nausea and vomiting.       Decreased appetite.  Genitourinary:  Negative for dysuria, frequency and urgency.  Neurological:  Positive for headaches. Negative for dizziness.  Endo/Heme/Allergies:  Negative for polydipsia.  Psychiatric/Behavioral:  Negative for depression and suicidal ideas. The patient is not nervous/anxious.       Objective:     BP (!) 118/58   Pulse 72   Temp 98 F (36.7 C) (Oral)   Ht 5' 3 (1.6 m)   Wt 113 lb (51.3 kg)   SpO2 99%   BMI 20.02 kg/m  BP Readings from Last 3 Encounters:  08/29/24 (!) 118/58  01/31/24 (!) 110/58  01/06/24 (!) 168/68   Wt Readings from Last 3 Encounters:  08/29/24 113 lb (51.3 kg)  01/31/24 116 lb (52.6 kg)  01/06/24 114 lb (51.7 kg)      Physical Exam Vitals and nursing note reviewed.  Constitutional:      Appearance: Normal appearance.  Cardiovascular:     Rate and Rhythm: Normal rate and regular rhythm.     Pulses: Normal pulses.     Heart sounds:  Normal heart sounds.  Pulmonary:     Effort: Pulmonary effort is normal.     Breath sounds: Normal breath sounds.  Neurological:     Mental Status: She is alert and oriented to person, place, and time.  Psychiatric:        Mood and Affect: Mood normal.        Behavior: Behavior normal.        Thought Content: Thought content normal.        Judgment: Judgment normal.      Results for orders placed or performed in visit on 08/29/24  POC COVID-19  Result Value Ref Range   SARS Coronavirus 2 Ag Negative Negative  POCT Influenza A/B  Result Value Ref Range   Influenza A, POC Negative Negative   Influenza B, POC Negative Negative       The 10-year ASCVD risk score (Arnett DK, et al., 2019) is: 16%    Assessment & Plan:  Acute cough -     POC COVID-19 BinaxNow -     POCT Influenza A/B    Assessment and Plan Assessment & Plan Acute bronchitis with upper respiratory infection Viral bronchitis indicated by symptoms and negative COVID-19 and influenza tests today.  - Start prednisone  20 mg tablets. Take 2 tablets by mouth once daily in the morning for 5 days. - Recommended Mucinex every 12 hours. - Advised Flonase if ear pain or sinus pressure develops. - continue DayQuil as needed. - Start Benzonatate capsules for cough. Take 1 capsule by mouth three times daily as needed for cough. - Encouraged rest, increased fluid intake, and use of a humidifier. - Instructed to monitor symptoms and contact if symptoms worsen or new symptoms develop. - Advised ER or urgent care visit if symptoms worsen.   Return if symptoms worsen or fail to improve.    Carrol Aurora, NP

## 2024-08-29 NOTE — Patient Instructions (Addendum)
 Start prednisone  20 mg tablets. Take 2 tablets by mouth once daily in the morning for 5 days.  Start Benzonatate capsules for cough. Take 1 capsule by mouth three times daily as needed for cough.   You can try a few things over the counter to help with your symptoms including:  Cough: Delsym or Robitussin (get the off brand, works just as well) Chest Congestion: Mucinex (plain) Nasal Congestion/Ear Pressure/Sinus Pressure: Try using Flonase (fluticasone) nasal spray. Instill 1 spray in each nostril twice daily. This can be purchased over the counter. Body aches, fevers, headache: Ibuprofen (not to exceed 2400 mg in 24 hours) or Acetaminophen -Tylenol  (not to exceed 3000 mg in 24 hours) Runny Nose/Throat Drainage/Sneezing/Itchy or Watery Eyes: An antihistamine such as Zyrtec, Claritin, Xyzal, Allegra  Rest, increase fluid intake and humidifier.  Update me later this week or early next week on your symptoms.   It was a pleasure meeting you!

## 2024-08-29 NOTE — Telephone Encounter (Signed)
 Noted

## 2024-09-20 DIAGNOSIS — Z08 Encounter for follow-up examination after completed treatment for malignant neoplasm: Secondary | ICD-10-CM | POA: Diagnosis not present

## 2024-09-20 DIAGNOSIS — L658 Other specified nonscarring hair loss: Secondary | ICD-10-CM | POA: Diagnosis not present

## 2024-09-20 DIAGNOSIS — C4441 Basal cell carcinoma of skin of scalp and neck: Secondary | ICD-10-CM | POA: Diagnosis not present

## 2024-09-20 DIAGNOSIS — D485 Neoplasm of uncertain behavior of skin: Secondary | ICD-10-CM | POA: Diagnosis not present

## 2024-09-20 DIAGNOSIS — L570 Actinic keratosis: Secondary | ICD-10-CM | POA: Diagnosis not present

## 2024-09-20 DIAGNOSIS — Z85828 Personal history of other malignant neoplasm of skin: Secondary | ICD-10-CM | POA: Diagnosis not present

## 2024-09-20 DIAGNOSIS — L821 Other seborrheic keratosis: Secondary | ICD-10-CM | POA: Diagnosis not present

## 2024-09-20 DIAGNOSIS — L6612 Frontal fibrosing alopecia: Secondary | ICD-10-CM | POA: Diagnosis not present

## 2024-09-20 DIAGNOSIS — D1801 Hemangioma of skin and subcutaneous tissue: Secondary | ICD-10-CM | POA: Diagnosis not present

## 2024-10-03 DIAGNOSIS — H0014 Chalazion left upper eyelid: Secondary | ICD-10-CM | POA: Diagnosis not present

## 2024-11-01 ENCOUNTER — Other Ambulatory Visit: Payer: Self-pay | Admitting: Primary Care

## 2024-11-01 DIAGNOSIS — I10 Essential (primary) hypertension: Secondary | ICD-10-CM

## 2024-11-02 DIAGNOSIS — M5412 Radiculopathy, cervical region: Secondary | ICD-10-CM | POA: Diagnosis not present

## 2024-11-13 ENCOUNTER — Encounter: Payer: Medicare HMO | Admitting: Primary Care

## 2024-11-13 VITALS — BP 136/70 | HR 64 | Temp 98.0°F | Ht 62.0 in | Wt 115.0 lb

## 2024-11-13 DIAGNOSIS — M542 Cervicalgia: Secondary | ICD-10-CM

## 2024-11-13 DIAGNOSIS — Z23 Encounter for immunization: Secondary | ICD-10-CM | POA: Diagnosis not present

## 2024-11-13 DIAGNOSIS — Z0000 Encounter for general adult medical examination without abnormal findings: Secondary | ICD-10-CM

## 2024-11-13 DIAGNOSIS — R7303 Prediabetes: Secondary | ICD-10-CM | POA: Insufficient documentation

## 2024-11-13 DIAGNOSIS — E785 Hyperlipidemia, unspecified: Secondary | ICD-10-CM

## 2024-11-13 DIAGNOSIS — I10 Essential (primary) hypertension: Secondary | ICD-10-CM | POA: Diagnosis not present

## 2024-11-13 DIAGNOSIS — G8929 Other chronic pain: Secondary | ICD-10-CM

## 2024-11-13 NOTE — Assessment & Plan Note (Signed)
 Controlled.  Continue valsartan 80 mg daily. CMP pending.

## 2024-11-13 NOTE — Assessment & Plan Note (Signed)
 Repeat lipid panel pending.  Work on a healthy diet and regular exercise in order for weight loss, and to reduce the risk of further co-morbidity.

## 2024-11-13 NOTE — Patient Instructions (Signed)
 Stop by the lab prior to leaving today. I will notify you of your results once received.   It was a pleasure to see you today!

## 2024-11-13 NOTE — Assessment & Plan Note (Signed)
 Following with orthopedics.  Continue Meloxicam  15 mg PRN.

## 2024-11-13 NOTE — Progress Notes (Signed)
 "  Subjective:    Patient ID: Robyn House, female    DOB: June 23, 1951, 73 y.o.   MRN: [id number]  Robyn House is a very pleasant 73 y.o. female who presents today for complete physical and follow up of chronic conditions.  Immunizations: -Tetanus: Completed in 2016 -Influenza: Influenza vaccine provided today.   -Shingles: Completed Shingrix series -Pneumonia: Completed in 2018  Diet: Fair diet.  Exercise: Regular exercise.  Eye exam: Completes annually  Dental exam: Completes semi-annually    Mammogram: Completed in June 2025 Bone Density Scan: Completed in June 2025  Colonoscopy: Completed in 2020 no further screening needed per patient  BP Readings from Last 3 Encounters:  11/13/24 136/70  08/29/24 (!) 118/58  01/31/24 (!) 110/58       Review of Systems  Constitutional:  Negative for unexpected weight change.  HENT:  Negative for rhinorrhea.   Respiratory:  Negative for cough and shortness of breath.   Cardiovascular:  Negative for chest pain.  Gastrointestinal:  Negative for constipation and diarrhea.  Genitourinary:  Negative for difficulty urinating.  Musculoskeletal:  Positive for arthralgias and neck pain.  Skin:  Negative for rash.  Allergic/Immunologic: Negative for environmental allergies.  Neurological:  Negative for dizziness, numbness and headaches.  Psychiatric/Behavioral:  The patient is nervous/anxious.          Past Medical History:  Diagnosis Date   Anemia    distant past   Anorexia    college   Anxiety    Cervicalgia    Had MRI, seeing neurosurgeon 09/22/15   Community acquired pneumonia 11/28/2018   Depression    Hammertoe    LEFT 2ND   Rib pain on left side 04/21/2023   Stiff neck    limited turn to left   Tear of left supraspinatus tendon 10/30/2021   Toenail avulsion 07/11/2015   Urinary urgency 12/10/2021   Vaginal irritation 12/10/2021    Social History   Socioeconomic History   Marital status: Married     Spouse name: Not on file   Number of children: Not on file   Years of education: Not on file   Highest education level: 12th grade  Occupational History   Not on file  Tobacco Use   Smoking status: Never   Smokeless tobacco: Never  Substance and Sexual Activity   Alcohol use: Yes    Alcohol/week: 5.0 standard drinks of alcohol    Types: 5 Glasses of wine per week   Drug use: Never   Sexual activity: Not Currently  Other Topics Concern   Not on file  Social History Narrative   Lives in [place] with husband.    Has 3 children and 1 step son, 84, 59, 80, 8. 1 son in [place], step son in [place]. 2 sons in [place].    No pets.   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    Social Drivers of Health   Tobacco Use: Low Risk (11/13/2024)   Patient History    Smoking Tobacco Use: Never    Smokeless Tobacco Use: Never    Passive Exposure: Not on file  Financial Resource Strain: Low Risk (11/13/2024)   Overall Financial Resource Strain (CARDIA)    Difficulty of Paying Living Expenses: Not hard at all  Food Insecurity: No Food Insecurity (11/13/2024)   Epic    Worried About  Running Out of Food in the Last Year: Never true    Ran Out of Food in the Last Year: Never true  Transportation Needs: No Transportation Needs (11/13/2024)   Epic    Lack of Transportation (Medical): No    Lack of Transportation (Non-Medical): No  Physical Activity: Sufficiently Active (11/13/2024)   Exercise Vital Sign    Days of Exercise per Week: 7 days    Minutes of Exercise per Session: 120 min  Stress: Stress Concern Present (11/13/2024)   [employer] of Occupational Health - Occupational Stress Questionnaire    Feeling of Stress: To some extent  Social Connections: Socially Integrated (11/13/2024)   Social Connection and Isolation Panel    Frequency of Communication with  Friends and Family: More than three times a week    Frequency of Social Gatherings with Friends and Family: Three times a week    Attends Religious Services: More than 4 times per year    Active Member of Clubs or Organizations: Yes    Attends Banker Meetings: 1 to 4 times per year    Marital Status: Married  Catering Manager Violence: Not At Risk (10/06/2023)   Humiliation, Afraid, Rape, and Kick questionnaire    Fear of Current or Ex-Partner: No    Emotionally Abused: No    Physically Abused: No    Sexually Abused: No  Depression (PHQ2-9): Low Risk (11/13/2024)   Depression (PHQ2-9)    PHQ-2 Score: 0  Alcohol Screen: Low Risk (11/13/2024)   Alcohol Screen    Last Alcohol Screening Score (AUDIT): 4  Housing: Low Risk (11/13/2024)   Epic    Unable to Pay for Housing in the Last Year: No    Number of Times Moved in the Last Year: 0    Homeless in the Last Year: No  Utilities: Not At Risk (10/06/2023)   AHC Utilities    Threatened with loss of utilities: No  Health Literacy: Adequate Health Literacy (10/06/2023)   B1300 Health Literacy    Frequency of need for help with medical instructions: Never    Past Surgical History:  Procedure Laterality Date   CORRECTION OVERLAPPING TOES Left 09/03/2015   Procedure: CORRECTION OVERLAPPING TOE LEFT SECOND TOE;  Surgeon: Eva Gay, DPM;  Location: [facility];  Service: Podiatry;  Laterality: Left;   HALLUX VALGUS LAPIDUS Left 09/03/2015   Procedure: HALLUX VALGUS LAPIDUS LEFT FOOT;  Surgeon: Eva Gay, DPM;  Location: [facility];  Service: Podiatry;  Laterality: Left;  WITH POPLITEAL 2ND CASE PER SCHEDULING SHEET   HAMMER TOE SURGERY Left 09/03/2015   Procedure: HAMMER TOE CORRECTION LEFT SECOND TOE;  Surgeon: Eva Gay, DPM;  Location: [facility];  Service: Podiatry;  Laterality: Left;   INNER EAR SURGERY     Right ear drum collapsed, Dr. Herminio   SHOULDER SURGERY Right    Rotator  cuff repair   TONSILECTOMY/ADENOIDECTOMY WITH MYRINGOTOMY     TUBAL LIGATION  1986    Family History  Problem Relation Age of Onset   Stroke Mother    Heart disease Mother        arrhythmia   Anxiety disorder Mother    Early death Mother    Stroke Father    Arthritis Maternal Grandmother    Heart disease Maternal Grandfather    Breast cancer Other 39   Stroke Maternal Uncle     Allergies[1]  Medications Ordered Prior to Encounter[2]  BP 136/70   Pulse 64   Temp  98 F (36.7 C) (Oral)   Ht 5' 2 (1.575 m)   Wt 115 lb (52.2 kg)   SpO2 100%   BMI 21.03 kg/m  Objective:   Physical Exam HENT:     Right Ear: Tympanic membrane and ear canal normal.     Left Ear: Tympanic membrane and ear canal normal.  Eyes:     Pupils: Pupils are equal, round, and reactive to light.  Cardiovascular:     Rate and Rhythm: Normal rate and regular rhythm.  Pulmonary:     Effort: Pulmonary effort is normal.     Breath sounds: Normal breath sounds.  Abdominal:     General: Bowel sounds are normal.     Palpations: Abdomen is soft.     Tenderness: There is no abdominal tenderness.  Musculoskeletal:        General: Normal range of motion.     Cervical back: Neck supple.  Skin:    General: Skin is warm and dry.  Neurological:     Mental Status: She is alert and oriented to person, place, and time.     Cranial Nerves: No cranial nerve deficit.     Deep Tendon Reflexes:     Reflex Scores:      Patellar reflexes are 2+ on the right side and 2+ on the left side. Psychiatric:        Mood and Affect: Mood normal.     Physical Exam        Assessment & Plan:  Routine general medical examination at a health care facility Assessment & Plan: Immunizations UTD. Influenza vaccine provided today. Mammogram and bone density UTD Colonoscopy UTD, no further imaging needed per patient.  Discussed the importance of a healthy diet and regular exercise in order for weight loss, and to reduce  the risk of further co-morbidity.  Exam stable. Labs pending.  Follow up in 1 year for repeat physical.    Primary hypertension Assessment & Plan: Controlled.  Continue valsartan  80 mg daily.  CMP pending.   Orders: -     Comprehensive metabolic panel with GFR  Hyperlipidemia, unspecified hyperlipidemia type Assessment & Plan: Repeat lipid panel pending.  Work on a healthy diet and regular exercise in order for weight loss, and to reduce the risk of further co-morbidity.   Orders: -     Lipid panel  Chronic neck pain Assessment & Plan: Following with orthopedics.  Continue Meloxicam  15 mg PRN.    Prediabetes Assessment & Plan: Repeat A1C pending.  Work on a healthy diet and regular exercise in order for weight loss, and to reduce the risk of further co-morbidity.   Orders: -     Hemoglobin A1c    Assessment and Plan Assessment & Plan         Comer MARLA Gaskins, NP       [1] No Known Allergies [2]  Current Outpatient Medications on File Prior to Visit  Medication Sig Dispense Refill   calcium-vitamin D  (OSCAL WITH D) 500-5 MG-MCG tablet Take 1 tablet by mouth.     meloxicam  (MOBIC ) 15 MG tablet      MULTIPLE VITAMINS PO Take by mouth.     valsartan  (DIOVAN ) 80 MG tablet TAKE ONE TABLET BY MOUTH ONCE DAILY FOR BLOOD PRESSURE (HIGH) 90 tablet 0   No current facility-administered medications on file prior to visit.   "

## 2024-11-13 NOTE — Assessment & Plan Note (Addendum)
 Immunizations UTD. Influenza vaccine provided today. Mammogram and bone density UTD Colonoscopy UTD, no further imaging needed per patient.  Discussed the importance of a healthy diet and regular exercise in order for weight loss, and to reduce the risk of further co-morbidity.  Exam stable. Labs pending.  Follow up in 1 year for repeat physical.

## 2024-11-13 NOTE — Addendum Note (Signed)
 Addended by: Alvita Fana on: 11/13/2024 02:57 PM   Modules accepted: Orders

## 2024-11-13 NOTE — Assessment & Plan Note (Signed)
 Repeat A1C pending.  Work on a healthy diet and regular exercise in order for weight loss, and to reduce the risk of further co-morbidity.

## 2024-11-14 ENCOUNTER — Ambulatory Visit: Payer: Self-pay | Admitting: Primary Care

## 2024-11-14 DIAGNOSIS — E785 Hyperlipidemia, unspecified: Secondary | ICD-10-CM

## 2024-11-14 LAB — COMPREHENSIVE METABOLIC PANEL WITH GFR
ALT: 23 U/L (ref 3–35)
AST: 19 U/L (ref 5–37)
Albumin: 4.3 g/dL (ref 3.5–5.2)
Alkaline Phosphatase: 53 U/L (ref 39–117)
BUN: 23 mg/dL (ref 6–23)
CO2: 29 meq/L (ref 19–32)
Calcium: 9.8 mg/dL (ref 8.4–10.5)
Chloride: 103 meq/L (ref 96–112)
Creatinine, Ser: 0.69 mg/dL (ref 0.40–1.20)
GFR: 85.61 mL/min
Glucose, Bld: 90 mg/dL (ref 70–99)
Potassium: 4.8 meq/L (ref 3.5–5.1)
Sodium: 138 meq/L (ref 135–145)
Total Bilirubin: 0.4 mg/dL (ref 0.2–1.2)
Total Protein: 7.6 g/dL (ref 6.0–8.3)

## 2024-11-14 LAB — LIPID PANEL
Cholesterol: 222 mg/dL — ABNORMAL HIGH (ref 28–200)
HDL: 77.5 mg/dL
LDL Cholesterol: 119 mg/dL — ABNORMAL HIGH (ref 10–99)
NonHDL: 144.06
Total CHOL/HDL Ratio: 3
Triglycerides: 126 mg/dL (ref 10.0–149.0)
VLDL: 25.2 mg/dL (ref 0.0–40.0)

## 2024-11-14 LAB — HEMOGLOBIN A1C: Hgb A1c MFr Bld: 5.7 % (ref 4.6–6.5)

## 2025-04-10 ENCOUNTER — Other Ambulatory Visit

## 2025-11-14 ENCOUNTER — Encounter: Admitting: Primary Care
